# Patient Record
Sex: Female | Born: 1950 | Race: Black or African American | Hispanic: No | Smoking: Never smoker
Health system: Southern US, Community
[De-identification: ages and names within clinical notes are randomized; demographics above are authoritative.]

## PROBLEM LIST (undated history)

## (undated) DIAGNOSIS — M1990 Unspecified osteoarthritis, unspecified site: Secondary | ICD-10-CM

## (undated) HISTORY — PX: BACK SURGERY: SHX140

---

## 2004-06-13 DIAGNOSIS — E119 Type 2 diabetes mellitus without complications: Secondary | ICD-10-CM | POA: Insufficient documentation

## 2005-08-28 DIAGNOSIS — N938 Other specified abnormal uterine and vaginal bleeding: Secondary | ICD-10-CM | POA: Insufficient documentation

## 2008-02-09 DIAGNOSIS — E7800 Pure hypercholesterolemia, unspecified: Secondary | ICD-10-CM | POA: Insufficient documentation

## 2008-09-17 DIAGNOSIS — Z79899 Other long term (current) drug therapy: Secondary | ICD-10-CM | POA: Insufficient documentation

## 2008-10-07 ENCOUNTER — Ambulatory Visit: Payer: Self-pay | Admitting: Diagnostic Radiology

## 2008-10-07 ENCOUNTER — Emergency Department (HOSPITAL_BASED_OUTPATIENT_CLINIC_OR_DEPARTMENT_OTHER): Admission: EM | Admit: 2008-10-07 | Discharge: 2008-10-07 | Payer: Self-pay | Admitting: Emergency Medicine

## 2008-10-08 ENCOUNTER — Emergency Department (HOSPITAL_BASED_OUTPATIENT_CLINIC_OR_DEPARTMENT_OTHER): Admission: EM | Admit: 2008-10-08 | Discharge: 2008-10-08 | Payer: Self-pay | Admitting: Emergency Medicine

## 2011-05-22 ENCOUNTER — Emergency Department (INDEPENDENT_AMBULATORY_CARE_PROVIDER_SITE_OTHER): Payer: Federal, State, Local not specified - PPO

## 2011-05-22 ENCOUNTER — Other Ambulatory Visit: Payer: Self-pay

## 2011-05-22 ENCOUNTER — Encounter: Payer: Self-pay | Admitting: *Deleted

## 2011-05-22 ENCOUNTER — Emergency Department (HOSPITAL_BASED_OUTPATIENT_CLINIC_OR_DEPARTMENT_OTHER)
Admission: EM | Admit: 2011-05-22 | Discharge: 2011-05-22 | Disposition: A | Discharge status: Home / Self Care | Payer: Federal, State, Local not specified - PPO | Attending: Emergency Medicine | Admitting: Emergency Medicine

## 2011-05-22 DIAGNOSIS — N200 Calculus of kidney: Secondary | ICD-10-CM | POA: Insufficient documentation

## 2011-05-22 DIAGNOSIS — M549 Dorsalgia, unspecified: Secondary | ICD-10-CM

## 2011-05-22 DIAGNOSIS — E119 Type 2 diabetes mellitus without complications: Secondary | ICD-10-CM | POA: Insufficient documentation

## 2011-05-22 DIAGNOSIS — R079 Chest pain, unspecified: Secondary | ICD-10-CM

## 2011-05-22 DIAGNOSIS — Q619 Cystic kidney disease, unspecified: Secondary | ICD-10-CM

## 2011-05-22 DIAGNOSIS — R0789 Other chest pain: Secondary | ICD-10-CM

## 2011-05-22 LAB — CBC
HCT: 38.1 % (ref 36.0–46.0)
MCV: 85.2 fL (ref 78.0–100.0)
RBC: 4.47 MIL/uL (ref 3.87–5.11)
WBC: 7.7 10*3/uL (ref 4.0–10.5)

## 2011-05-22 LAB — DIFFERENTIAL
Eosinophils Relative: 1 % (ref 0–5)
Lymphocytes Relative: 30 % (ref 12–46)
Lymphs Abs: 2.3 10*3/uL (ref 0.7–4.0)
Monocytes Absolute: 0.7 10*3/uL (ref 0.1–1.0)
Monocytes Relative: 9 % (ref 3–12)

## 2011-05-22 LAB — CARDIAC PANEL(CRET KIN+CKTOT+MB+TROPI)
CK, MB: 3.9 ng/mL (ref 0.3–4.0)
CK, MB: 3.8 ng/mL (ref 0.3–4.0)
Relative Index: 2.1 (ref 0.0–2.5)
Total CK: 197 U/L — ABNORMAL HIGH (ref 7–177)

## 2011-05-22 LAB — COMPREHENSIVE METABOLIC PANEL
CO2: 27 mEq/L (ref 19–32)
Calcium: 10.2 mg/dL (ref 8.4–10.5)
Creatinine, Ser: 0.5 mg/dL (ref 0.50–1.10)
GFR calc Af Amer: >90 mL/min (ref >90)
GFR calc non Af Amer: >90 mL/min (ref >90)
Glucose, Bld: 74 mg/dL (ref 70–99)

## 2011-05-22 LAB — URINALYSIS, ROUTINE W REFLEX MICROSCOPIC
Bilirubin Urine: NEGATIVE
Nitrite: NEGATIVE
Protein, ur: NEGATIVE mg/dL
Specific Gravity, Urine: 1.023 (ref 1.005–1.030)
Urobilinogen, UA: 1 mg/dL (ref 0.0–1.0)

## 2011-05-22 NOTE — ED Provider Notes (Addendum)
History     CSN: [id number] Arrival date & time: 05/22/2011  4:21 PM   First MD Initiated Contact with Patient 05/22/11 1651      Chief Complaint  Patient presents with  . Chest Pain    (Consider location/radiation/quality/duration/timing/severity/associated sxs/prior treatment) Patient is a 60 y.o. female presenting with chest pain. The history is provided by the patient.  Chest Pain The chest pain began less than 1 hour ago. Duration of episode(s) is 2 seconds. Chest pain occurs intermittently. The chest pain is resolved. Associated with: nothing. At its most intense, the pain is at 2/10. The pain is currently at 0/10. The severity of the pain is mild. The quality of the pain is described as pressure-like and dull. The pain does not radiate. Exacerbated by: nothing. Pertinent negatives for primary symptoms include no fever, no fatigue, no shortness of breath, no cough, no wheezing, no abdominal pain, no nausea, no vomiting and no dizziness. She tried nothing for the symptoms. Risk factors include stress.  Her past medical history is significant for diabetes.  Pertinent negatives for past medical history include no CAD.  Pertinent negatives for family medical history include: no heart disease in family.  Procedure history is positive for stress thallium.     Past Medical History  Diagnosis Date  . Diabetes mellitus     History reviewed. No pertinent past surgical history.  No family history on file.  History  Substance Use Topics  . Smoking status: Never Smoker   . Smokeless tobacco: Not on file  . Alcohol Use: No    OB History    Grav Para Term Preterm Abortions TAB SAB Ect Mult Living                  Review of Systems  Constitutional: Negative for fever and fatigue.  Respiratory: Negative for cough, shortness of breath and wheezing.   Cardiovascular: Positive for chest pain.  Gastrointestinal: Negative for nausea, vomiting and abdominal pain.  Neurological:  Negative for dizziness.  All other systems reviewed and are negative.    Allergies  Review of patient's allergies indicates no known allergies.  Home Medications  No current outpatient prescriptions on file.  BP 143/73  Pulse 81  Temp(Src) 98.5 F (36.9 C) (Oral)  Resp 18  SpO2 97%  Physical Exam  Nursing note and vitals reviewed. Constitutional: She is oriented to person, place, and time. She appears well-developed and well-nourished. No distress.  HENT:  Head: Normocephalic and atraumatic.  Eyes: EOM are normal. Pupils are equal, round, and reactive to light.  Cardiovascular: Normal rate, regular rhythm, normal heart sounds and intact distal pulses.  Exam reveals no friction rub.   No murmur heard. Pulmonary/Chest: Effort normal and breath sounds normal. She has no wheezes. She has no rales. She exhibits tenderness.       Some mild tenderness while palpating the sternum  Abdominal: Soft. Bowel sounds are normal. She exhibits no distension. There is no tenderness. There is no rebound and no guarding.  Musculoskeletal: Normal range of motion. She exhibits no tenderness.       No edema  Neurological: She is alert and oriented to person, place, and time. No cranial nerve deficit.  Skin: Skin is warm and dry. No rash noted.  Psychiatric: She has a normal mood and affect. Her behavior is normal.    ED Course  Procedures (including critical care time)  Results for orders placed during the hospital encounter of 05/22/11  CBC  Component Value Range   WBC 7.7  4.0 - 10.5 (K/uL)   RBC 4.47  3.87 - 5.11 (MIL/uL)   Hemoglobin 12.2  12.0 - 15.0 (g/dL)   HCT 16.1  09.6 - 04.5 (%)   MCV 85.2  78.0 - 100.0 (fL)   MCH 27.3  26.0 - 34.0 (pg)   MCHC 32.0  30.0 - 36.0 (g/dL)   RDW 40.9  81.1 - 91.4 (%)   Platelets 349  150 - 400 (K/uL)  DIFFERENTIAL      Component Value Range   Neutrophils Relative 59  43 - 77 (%)   Neutro Abs 4.6  1.7 - 7.7 (K/uL)   Lymphocytes Relative 30   12 - 46 (%)   Lymphs Abs 2.3  0.7 - 4.0 (K/uL)   Monocytes Relative 9  3 - 12 (%)   Monocytes Absolute 0.7  0.1 - 1.0 (K/uL)   Eosinophils Relative 1  0 - 5 (%)   Eosinophils Absolute 0.1  0.0 - 0.7 (K/uL)   Basophils Relative 0  0 - 1 (%)   Basophils Absolute 0.0  0.0 - 0.1 (K/uL)  COMPREHENSIVE METABOLIC PANEL      Component Value Range   Sodium 141  135 - 145 (mEq/L)   Potassium 4.0  3.5 - 5.1 (mEq/L)   Chloride 104  96 - 112 (mEq/L)   CO2 27  19 - 32 (mEq/L)   Glucose, Bld 74  70 - 99 (mg/dL)   BUN 15  6 - 23 (mg/dL)   Creatinine, Ser 7.82  0.50 - 1.10 (mg/dL)   Calcium 95.6  8.4 - 10.5 (mg/dL)   Total Protein 7.4  6.0 - 8.3 (g/dL)   Albumin 3.6  3.5 - 5.2 (g/dL)   AST 18  0 - 37 (U/L)   ALT 11  0 - 35 (U/L)   Alkaline Phosphatase 86  39 - 117 (U/L)   Total Bilirubin 0.1 (*) 0.3 - 1.2 (mg/dL)   GFR calc non Af Amer >90  >90 (mL/min)   GFR calc Af Amer >90  >90 (mL/min)  URINALYSIS, ROUTINE W REFLEX MICROSCOPIC      Component Value Range   Color, Urine YELLOW  YELLOW    Appearance CLEAR  CLEAR    Specific Gravity, Urine 1.023  1.005 - 1.030    pH 7.0  5.0 - 8.0    Glucose, UA NEGATIVE  NEGATIVE (mg/dL)   Hgb urine dipstick NEGATIVE  NEGATIVE    Bilirubin Urine NEGATIVE  NEGATIVE    Ketones, ur NEGATIVE  NEGATIVE (mg/dL)   Protein, ur NEGATIVE  NEGATIVE (mg/dL)   Urobilinogen, UA 1.0  0.0 - 1.0 (mg/dL)   Nitrite NEGATIVE  NEGATIVE    Leukocytes, UA NEGATIVE  NEGATIVE   CARDIAC PANEL(CRET KIN+CKTOT+MB+TROPI)      Component Value Range   Total CK 197 (*) 7 - 177 (U/L)   CK, MB 3.9  0.3 - 4.0 (ng/mL)   Troponin I <0.30  <0.30 (ng/mL)   Relative Index 2.0  0.0 - 2.5   CARDIAC PANEL(CRET KIN+CKTOT+MB+TROPI)      Component Value Range   Total CK 184 (*) 7 - 177 (U/L)   CK, MB 3.8  0.3 - 4.0 (ng/mL)   Troponin I <0.30  <0.30 (ng/mL)   Relative Index 2.1  0.0 - 2.5    Dg Chest 2 View  05/22/2011  *RADIOLOGY REPORT*  Clinical Data: Chest pain.  CHEST - 2 VIEW   Comparison: None  Findings: The cardiac silhouette, mediastinal and hilar contours are within normal limits.  The lungs are clear.  No pleural effusion.  The bony thorax is intact. Bursal versus rotator cuff calcification on the right.  IMPRESSION: No acute cardiopulmonary findings.  Original Report Authenticated By: P. Loralie Champagne, M.D.   US Abdomen Complete  05/22/2011  *RADIOLOGY REPORT*  Clinical Data:  Intermittent chest/back pain  COMPLETE ABDOMINAL ULTRASOUND  Comparison:  None.  Findings:  Gallbladder:  No gallstones, gallbladder wall thickening, or pericholecystic fluid.  Negative sonographic Murphy's sign.  Common bile duct:  Measures 3 mm.  Liver:  Coarse, hyperechoic hepatic parenchyma, suggesting hepatic steatosis.  No focal hepatic lesions are seen.  IVC:  Poorly visualized due to overlying bowel gas.  Pancreas:  Poorly visualized due to overlying bowel gas.  Spleen:  Measures 5.0 cm.  Right Kidney:  Measures 12.7 cm.  2.5 x 2.4 x 2.3 cm lower pole cyst.  Left Kidney:  Measures 12.3 cm.  No mass or hydronephrosis.  Abdominal aorta:  No aneurysm identified.  IMPRESSION: Normal sonographic appearance of the gallbladder.  Suspected hepatic steatosis.  2.5 cm right lower pole renal cyst.  Original Report Authenticated By: Charline Bills, M.D.       Date: 05/22/2011  Rate: 74  Rhythm: normal sinus rhythm  QRS Axis: normal  Intervals: normal  ST/T Wave abnormalities: normal  Conduction Disutrbances:none  Narrative Interpretation:   Old EKG Reviewed: none available    No diagnosis found.    MDM   Pt with atypical story for CP with a pressure sensation for seconds and then goes away. Pt states she has had this many times before and actually within the last 6months had a nuclear stress test for the same thing and it was normal.  TIMI 1 for aspirin daily and no risk factor is DM.  No sx concerning for PE.  EKG wnl.  CXR, CBC, BMP, CE (0, 3hr) all wnl.  Pt has been pain free since  here but states the last few weeks has had more stress than normal and may be a contributor.  Denies infectious sx or resp sx.  No associated sx and not related to eating.  States earlier the pain when into her mid back but did not stay and gone now.   Will have her f/u with Dr. Silva Bandy on Monday for recheck or return if sx worsen.         Gwyneth Sprout, MD 05/22/11 2009  Gwyneth Sprout, MD 05/22/11 2010

## 2011-05-22 NOTE — ED Notes (Signed)
Pt c/o intermittent pressure in the center of her chest since last week. Pt sts today the pressure radiates to her back.

## 2011-11-08 ENCOUNTER — Emergency Department (INDEPENDENT_AMBULATORY_CARE_PROVIDER_SITE_OTHER): Payer: Federal, State, Local not specified - PPO

## 2011-11-08 ENCOUNTER — Encounter (HOSPITAL_BASED_OUTPATIENT_CLINIC_OR_DEPARTMENT_OTHER): Payer: Self-pay | Admitting: *Deleted

## 2011-11-08 ENCOUNTER — Emergency Department (HOSPITAL_BASED_OUTPATIENT_CLINIC_OR_DEPARTMENT_OTHER)
Admission: EM | Admit: 2011-11-08 | Discharge: 2011-11-08 | Disposition: A | Discharge status: Home / Self Care | Payer: Federal, State, Local not specified - PPO | Attending: Emergency Medicine | Admitting: Emergency Medicine

## 2011-11-08 DIAGNOSIS — R0789 Other chest pain: Secondary | ICD-10-CM | POA: Insufficient documentation

## 2011-11-08 DIAGNOSIS — I517 Cardiomegaly: Secondary | ICD-10-CM

## 2011-11-08 DIAGNOSIS — R011 Cardiac murmur, unspecified: Secondary | ICD-10-CM | POA: Insufficient documentation

## 2011-11-08 DIAGNOSIS — M549 Dorsalgia, unspecified: Secondary | ICD-10-CM

## 2011-11-08 DIAGNOSIS — R42 Dizziness and giddiness: Secondary | ICD-10-CM | POA: Insufficient documentation

## 2011-11-08 DIAGNOSIS — E119 Type 2 diabetes mellitus without complications: Secondary | ICD-10-CM | POA: Insufficient documentation

## 2011-11-08 DIAGNOSIS — F411 Generalized anxiety disorder: Secondary | ICD-10-CM | POA: Insufficient documentation

## 2011-11-08 DIAGNOSIS — Z86718 Personal history of other venous thrombosis and embolism: Secondary | ICD-10-CM | POA: Insufficient documentation

## 2011-11-08 DIAGNOSIS — Z794 Long term (current) use of insulin: Secondary | ICD-10-CM | POA: Insufficient documentation

## 2011-11-08 DIAGNOSIS — Z7982 Long term (current) use of aspirin: Secondary | ICD-10-CM | POA: Insufficient documentation

## 2011-11-08 DIAGNOSIS — F419 Anxiety disorder, unspecified: Secondary | ICD-10-CM

## 2011-11-08 LAB — CBC
HCT: 35.9 % — ABNORMAL LOW (ref 36.0–46.0)
Hemoglobin: 11.7 g/dL — ABNORMAL LOW (ref 12.0–15.0)
MCH: 28.3 pg (ref 26.0–34.0)
MCHC: 32.6 g/dL (ref 30.0–36.0)
MCV: 86.9 fL (ref 78.0–100.0)
RDW: 14 % (ref 11.5–15.5)

## 2011-11-08 LAB — COMPREHENSIVE METABOLIC PANEL
ALT: 17 U/L (ref 0–35)
AST: 18 U/L (ref 0–37)
Albumin: 3.2 g/dL — ABNORMAL LOW (ref 3.5–5.2)
Alkaline Phosphatase: 82 U/L (ref 39–117)
GFR calc Af Amer: >90 mL/min (ref >90)
Glucose, Bld: 90 mg/dL (ref 70–99)
Potassium: 4.2 mEq/L (ref 3.5–5.1)
Sodium: 139 mEq/L (ref 135–145)
Total Protein: 6.7 g/dL (ref 6.0–8.3)

## 2011-11-08 MED ORDER — LORAZEPAM 2 MG/ML IJ SOLN
1.0000 mg | Freq: Once | INTRAMUSCULAR | Status: AC
  Administered 2011-11-08: 1 mg via INTRAVENOUS
  Filled 2011-11-08: qty 1

## 2011-11-08 NOTE — ED Notes (Signed)
Pt here for multiple c/o.  Pt was seen here 1 year ago for same and "didnt find anything"  Pt states was at dinner tonight and became light headed.  Pt ambulatory from triage with no difficulties noted.

## 2011-11-08 NOTE — ED Notes (Signed)
Pt states she has been here for these same symptoms less than a year ago and "they didn't find anything" Also has hx of anxiety and diabetes. H/A for about a week. At noon today was sitting and felt dizzy and light-headed. Also had some back pain. Tonight at dinner, became light-headed again. Felt shaky. Nervous.

## 2011-11-08 NOTE — ED Provider Notes (Signed)
History  This chart was scribed for Gerhard Munch, MD by Shari Heritage. This patient was seen in room MH12/MH12 and the patient's care was started at 9:25PM.   CSN: [id number]  Arrival date & time 11/08/11  2108   First MD Initiated Contact with Patient 11/08/11 2117      Chief Complaint  Patient presents with  . Back Pain     The history is provided by the patient. No language interpreter was used.    Tracy Parks is a 61 y.o. female who presents to the Emergency Department complaining of moderate back pain and persistence since onset 10 hours ago associated lightheadness, dizziness, and anxiety. Patient also reports a headache that radiated to the upper back and chest that began several days ago but has since resolved. Patient began feeling lightheaded and dizzy during dinner. Patient describes the sensation as "woozy." Patient reports being startled by fire at [employer] during dinner and thinks that may have triggered her anxiety. Patient drank hot tea with mild improvement in anxiety levels. Patient came to ED one year ago for similar symptoms but was given no specific diagnosis. Patient denies fever, chills, SOB, abdominal pain, visual disturbance. Patient with h/o of diabetes and a blood clot in the right leg. Patient took blood thinners for four months, but ended medication 1 year ago. Patient is a non-smoker and denies alcohol use. She notes that she had a "normal" stress-echo ~1 yr ago at [facility]    Past Medical History  Diagnosis Date  . Diabetes mellitus     History reviewed. No pertinent past surgical history.  History reviewed. No pertinent family history.  History  Substance Use Topics  . Smoking status: Never Smoker   . Smokeless tobacco: Not on file  . Alcohol Use: No     Review of Systems Patient is positive for lightheadedness, dizziness, back pain, anxiety. Patient is negative for fever, chills, sore throat, congestions, chest pain, SOB, abdominal  pain, nausea, cough, chills, visual disturbance, dysuria, rash, weakness and confusion.  Allergies  Review of patient's allergies indicates no known allergies.  Home Medications   Current Outpatient Rx  Name Route Sig Dispense Refill  . ASPIRIN 81 MG PO TABS Oral Take 81 mg by mouth daily.    Marland Kitchen CALCIUM CARBONATE 600 MG PO TABS Oral Take 600 mg by mouth 2 (two) times daily with a meal.    . DICLOFENAC SODIUM 75 MG PO TBEC Oral Take 75 mg by mouth 2 (two) times daily.    Marland Kitchen GLIPIZIDE 5 MG PO TABS Oral Take 5 mg by mouth 2 (two) times daily before a meal.    . INSULIN GLARGINE 100 UNIT/ML [place] SOLN Subcutaneous Inject 30 Units into the skin daily.    Marland Kitchen LOSARTAN POTASSIUM 25 MG PO TABS Oral Take 25 mg by mouth daily.    Marland Kitchen METFORMIN HCL 1000 MG PO TABS Oral Take 1,000 mg by mouth 2 (two) times daily with a meal.    . SIMVASTATIN 40 MG PO TABS Oral Take 40 mg by mouth every evening.      Triage Vitals: BP 174/85  Pulse 67  Temp(Src) 98.2 F (36.8 C) (Oral)  Resp 18  Ht 5\' 4"  (1.626 m)  Wt 248 lb (112.492 kg)  BMI 42.57 kg/m2  SpO2 96%  Physical Exam  Nursing note and vitals reviewed. Constitutional: She is oriented to person, place, and time. She appears well-developed and well-nourished.  HENT:  Head: Normocephalic and atraumatic.  Neck:  Normal range of motion. Neck supple.       Full range of motion with supple neck. No paraspinal tenderness.  Cardiovascular: Normal rate and regular rhythm.   Murmur (slight murmur) heard. Pulmonary/Chest: Breath sounds normal. No respiratory distress. She has no wheezes. She has no rales.  Abdominal: Bowel sounds are normal. She exhibits no distension. There is no tenderness. There is no rebound.  Musculoskeletal: Normal range of motion.  Neurological: She is alert and oriented to person, place, and time.  Skin: Skin is warm and dry.  Psychiatric: She has a normal mood and affect. Her behavior is normal.    ED Course  Procedures (including  critical care time)  DIAGNOSTIC STUDIES: Oxygen Saturation is 96% on room air, adequate by my interpretation.   Pulse is 67.sr normal BP is 174/85.   Date: 11/08/2011  Rate: 60  Rhythm: normal sinus rhythm  QRS Axis: normal  Intervals: normal  ST/T Wave abnormalities: normal  Conduction Disutrbances:none  Narrative Interpretation:   Old EKG Reviewed: unchanged NORMAL   COORDINATION OF CARE: 9:36PM-Patient is informed of plan for care and agrees with course of treatment. I ordered blood work, chest X-ray and recommended medication for anxiety and administered Ativan.   Labs Reviewed  COMPREHENSIVE METABOLIC PANEL - Abnormal; Notable for the following:    Albumin 3.2 (*)    Total Bilirubin 0.2 (*)    All other components within normal limits  CBC - Abnormal; Notable for the following:    Hemoglobin 11.7 (*)    HCT 35.9 (*)    All other components within normal limits  TROPONIN I   Dg Chest 2 View  11/08/2011  *RADIOLOGY REPORT*  Clinical Data: Chest discomfort  CHEST - 2 VIEW  Comparison: 05/22/2011  Findings: Heart size upper normal limits to mildly enlarged. Aortic arch atherosclerosis.  Mild retrocardiac scarring versus atelectasis.  Otherwise no focal consolidation.  No pleural effusion or pneumothorax.  Multilevel degenerative changes.  No acute osseous abnormality.  IMPRESSION: Heart size upper normal limits to mildly large.  No acute process identified.  Original Report Authenticated By: Waneta Martins, M.D.     No diagnosis found.  On re-eval the patient noted a significant improvement in her condition.  MDM  I personally performed the services described in this documentation, which was scribed in my presence. The recorded information has been reviewed and considered.  This elderly F w MMP, semi-recent normal ECHO, now p/w back pain and anxiety.  On my exam the patient is in no distress, w unremarkable VS.  ECG is normal.  Given the absence of chest pain  complaints, vomiting, ams, and the endorsement of anxiety, there is low suspicion for ongoing acute pathology.  All findings, and the need for continued PMD F/U were discussed with the patient and her niece (who throughout notes that the patient seems "the same."      Gerhard Munch, MD 11/08/11 2358

## 2011-11-08 NOTE — Discharge Instructions (Signed)
Although your evaluation thus far has been reassuring, it is very important that you continue to have your condition re-evaluated and managed by your physician.  Please be sure to call tomorrow morning to discuss today's visit.  If you develop any new, or concerning changes in your condition.

## 2011-11-08 NOTE — ED Notes (Signed)
ECG done on arrival

## 2013-12-05 DIAGNOSIS — M160 Bilateral primary osteoarthritis of hip: Secondary | ICD-10-CM | POA: Insufficient documentation

## 2014-02-21 DIAGNOSIS — M5430 Sciatica, unspecified side: Secondary | ICD-10-CM | POA: Insufficient documentation

## 2014-02-21 DIAGNOSIS — I10 Essential (primary) hypertension: Secondary | ICD-10-CM | POA: Insufficient documentation

## 2014-02-21 DIAGNOSIS — E785 Hyperlipidemia, unspecified: Secondary | ICD-10-CM | POA: Insufficient documentation

## 2015-09-04 ENCOUNTER — Emergency Department (HOSPITAL_BASED_OUTPATIENT_CLINIC_OR_DEPARTMENT_OTHER)
Admission: EM | Admit: 2015-09-04 | Discharge: 2015-09-04 | Disposition: A | Discharge status: Home / Self Care | Payer: Medicare Other | Attending: Emergency Medicine | Admitting: Emergency Medicine

## 2015-09-04 DIAGNOSIS — Z79899 Other long term (current) drug therapy: Secondary | ICD-10-CM | POA: Insufficient documentation

## 2015-09-04 DIAGNOSIS — Z7982 Long term (current) use of aspirin: Secondary | ICD-10-CM | POA: Diagnosis not present

## 2015-09-04 DIAGNOSIS — E119 Type 2 diabetes mellitus without complications: Secondary | ICD-10-CM | POA: Insufficient documentation

## 2015-09-04 DIAGNOSIS — M542 Cervicalgia: Secondary | ICD-10-CM | POA: Insufficient documentation

## 2015-09-04 DIAGNOSIS — Z7984 Long term (current) use of oral hypoglycemic drugs: Secondary | ICD-10-CM | POA: Diagnosis not present

## 2015-09-04 DIAGNOSIS — Z794 Long term (current) use of insulin: Secondary | ICD-10-CM | POA: Insufficient documentation

## 2015-09-04 DIAGNOSIS — Z791 Long term (current) use of non-steroidal anti-inflammatories (NSAID): Secondary | ICD-10-CM | POA: Diagnosis not present

## 2015-09-04 DIAGNOSIS — M545 Low back pain: Secondary | ICD-10-CM | POA: Insufficient documentation

## 2015-09-04 MED ORDER — CYCLOBENZAPRINE HCL 10 MG PO TABS: 10.0000 mg | ORAL_TABLET | Freq: Three times a day (TID) | ORAL | Status: AC | PRN

## 2015-09-04 MED ORDER — HYDROCODONE-ACETAMINOPHEN 5-325 MG PO TABS: 1.0000 | ORAL_TABLET | Freq: Four times a day (QID) | ORAL | Status: AC | PRN

## 2015-09-04 NOTE — ED Notes (Signed)
Pt reports neck pain since Saturday motrin and heat gives minimal relief

## 2015-09-04 NOTE — ED Notes (Addendum)
Bilateral neck pain and stiffness not relieved by motrin at home.  Pt has prescription for tramadol, states she tries not to take it unless she needs it.  Pt last took  ibuprofen at 2200

## 2015-09-04 NOTE — ED Provider Notes (Signed)
CSN: [id number]     Arrival date & time 09/04/15  0010 History   First MD Initiated Contact with Patient 09/04/15 0345     Chief Complaint  Patient presents with  . Neck Pain     (Consider location/radiation/quality/duration/timing/severity/associated sxs/prior Treatment) HPI  This is a 65 year old female with a four-day history of pain in her neck. The pain is moderate to severe and is located in the soft tissue of the sides and back of the neck. Her neck feels stiff and she is having difficulty rotating her head to the left of the right due to pain. She denies injury. She denies radiation of the pain. She denies numbness or weakness of the upper extremities. She is here because she is having difficulty finding a comfortable position to sleep.  Past Medical History  Diagnosis Date  . Diabetes mellitus    No past surgical history on file. No family history on file. Social History  Substance Use Topics  . Smoking status: Never Smoker   . Smokeless tobacco: Not on file  . Alcohol Use: No   OB History    No data available     Review of Systems  All other systems reviewed and are negative.   Allergies  Lisinopril and Victoza  Home Medications   Prior to Admission medications   Medication Sig Start Date End Date Taking? Authorizing Provider  aspirin 81 MG tablet Take 81 mg by mouth daily.    Historical Provider, MD  calcium carbonate (OS-CAL) 600 MG TABS Take 600 mg by mouth 2 (two) times daily with a meal.    Historical Provider, MD  diclofenac (VOLTAREN) 75 MG EC tablet Take 75 mg by mouth 2 (two) times daily.    Historical Provider, MD  glipiZIDE (GLUCOTROL) 5 MG tablet Take 5 mg by mouth 2 (two) times daily before a meal.    Historical Provider, MD  insulin glargine (LANTUS) 100 UNIT/ML injection Inject 30 Units into the skin daily.    Historical Provider, MD  losartan (COZAAR) 25 MG tablet Take 25 mg by mouth daily.    Historical Provider, MD  metFORMIN (GLUCOPHAGE) 1000  MG tablet Take 1,000 mg by mouth 2 (two) times daily with a meal.    Historical Provider, MD  simvastatin (ZOCOR) 40 MG tablet Take 40 mg by mouth every evening.    Historical Provider, MD   BP 156/97 mmHg  Pulse 67  Temp(Src) 98 F (36.7 C) (Oral)  Resp 18  Ht  (1.651 m)  Wt 236 lb (107.049 kg)  BMI 39.27 kg/m2  SpO2 98%   Physical Exam  General: Well-developed, well-nourished female in no acute distress; appearance consistent with age of record HENT: normocephalic; atraumatic Eyes: pupils equal, round and reactive to light; extraocular muscles intact Neck: supple; posterior soft tissue tenderness with pain on rotation of the neck Heart: regular rate and rhythm Lungs: clear to auscultation bilaterally Abdomen: soft; nondistended; nontender; bowel sounds present Extremities: No deformity; full range of motion; pulses normal Neurologic: Awake, alert and oriented; motor function intact in all extremities and symmetric; no facial droop Skin: Warm and dry Psychiatric: Normal mood and affect    ED Course  Procedures (including critical care time)   MDM      Paula Libra, MD 09/04/15 [phone]

## 2015-09-04 NOTE — ED Notes (Signed)
MD at bedside. 

## 2015-09-04 NOTE — ED Notes (Signed)
Pt verbalizes understanding of d/c instructions and denies any further needs at this time. 

## 2016-08-17 ENCOUNTER — Encounter (HOSPITAL_BASED_OUTPATIENT_CLINIC_OR_DEPARTMENT_OTHER): Payer: Self-pay | Admitting: *Deleted

## 2016-08-17 ENCOUNTER — Emergency Department (HOSPITAL_BASED_OUTPATIENT_CLINIC_OR_DEPARTMENT_OTHER)
Admission: EM | Admit: 2016-08-17 | Discharge: 2016-08-17 | Disposition: A | Discharge status: Home / Self Care | Payer: Medicare Other | Attending: Emergency Medicine | Admitting: Emergency Medicine

## 2016-08-17 ENCOUNTER — Emergency Department (HOSPITAL_BASED_OUTPATIENT_CLINIC_OR_DEPARTMENT_OTHER): Payer: Medicare Other

## 2016-08-17 DIAGNOSIS — Z79899 Other long term (current) drug therapy: Secondary | ICD-10-CM | POA: Diagnosis not present

## 2016-08-17 DIAGNOSIS — R079 Chest pain, unspecified: Secondary | ICD-10-CM | POA: Diagnosis present

## 2016-08-17 DIAGNOSIS — E119 Type 2 diabetes mellitus without complications: Secondary | ICD-10-CM | POA: Diagnosis not present

## 2016-08-17 DIAGNOSIS — K219 Gastro-esophageal reflux disease without esophagitis: Secondary | ICD-10-CM | POA: Diagnosis not present

## 2016-08-17 DIAGNOSIS — Z794 Long term (current) use of insulin: Secondary | ICD-10-CM | POA: Diagnosis not present

## 2016-08-17 DIAGNOSIS — Z7982 Long term (current) use of aspirin: Secondary | ICD-10-CM | POA: Diagnosis not present

## 2016-08-17 DIAGNOSIS — K2960 Other gastritis without bleeding: Secondary | ICD-10-CM

## 2016-08-17 LAB — CBC
HCT: 36.9 % (ref 36.0–46.0)
Hemoglobin: 11.2 g/dL — ABNORMAL LOW (ref 12.0–15.0)
MCH: 25.3 pg — AB (ref 26.0–34.0)
MCHC: 30.4 g/dL (ref 30.0–36.0)
MCV: 83.5 fL (ref 78.0–100.0)
PLATELETS: 497 10*3/uL — AB (ref 150–400)
RBC: 4.42 MIL/uL (ref 3.87–5.11)
RDW: 16.9 % — ABNORMAL HIGH (ref 11.5–15.5)
WBC: 9.6 10*3/uL (ref 4.0–10.5)

## 2016-08-17 LAB — COMPREHENSIVE METABOLIC PANEL
ALK PHOS: 69 U/L (ref 38–126)
ALT: 24 U/L (ref 14–54)
AST: 25 U/L (ref 15–41)
Albumin: 3.4 g/dL — ABNORMAL LOW (ref 3.5–5.0)
Anion gap: 8 (ref 5–15)
BILIRUBIN TOTAL: 0.7 mg/dL (ref 0.3–1.2)
BUN: 14 mg/dL (ref 6–20)
CALCIUM: 9.5 mg/dL (ref 8.9–10.3)
CO2: 31 mmol/L (ref 22–32)
CREATININE: 0.75 mg/dL (ref 0.44–1.00)
Chloride: 101 mmol/L (ref 101–111)
Glucose, Bld: 180 mg/dL — ABNORMAL HIGH (ref 65–99)
Potassium: 3.5 mmol/L (ref 3.5–5.1)
Sodium: 140 mmol/L (ref 135–145)
TOTAL PROTEIN: 6.7 g/dL (ref 6.5–8.1)

## 2016-08-17 LAB — TROPONIN I

## 2016-08-17 MED ORDER — OMEPRAZOLE 20 MG PO CPDR: 20.0000 mg | DELAYED_RELEASE_CAPSULE | Freq: Every day | ORAL | 0 refills | Status: AC

## 2016-08-17 NOTE — ED Triage Notes (Signed)
Pt reports burning pain to her chest/epigastric area x 2 weeks off and on, seen by her pcp and told she is anemic, has apt with gi for follow up. Denies sob, burning moves from right to left of chest.

## 2016-08-17 NOTE — ED Provider Notes (Signed)
[facility] Provider Note   CSN: [id number] Arrival date & time: 08/17/16  1018     History   Chief Complaint Chief Complaint  Patient presents with  . Chest Pain    HPI  Blood pressure 146/94, pulse 96, temperature 97.9 F (36.6 C), temperature source Oral, resp. rate 18, height 5\' 3"  (1.6 m), weight 100.2 kg, SpO2 100 %.  Tracy Parks is a 66 y.o. female with past medical history significant for insulin-dependent diabetes, hyperlipidemia, states that she does not have high blood pressure but they started her on losartan for her diabetes, complaining of acute onset of right-sided chest pain this morning when she was driving her car. It radiated around to the left chest. She describes a sensation as burning, this frightened her and she drove immediately to the hospital, it subsided spontaneously after about 10 minutes. She cannot describe any exacerbating or alleviating factors. She had a similar episode several weeks ago. There was no associated diaphoresis, dizziness, nausea, vomiting, cough, fever, shortness of breath. She denies any family history of the ACS. She states that she recently saw her primary care doctor for her yearly checkup and was found to be anemic, guaiac is negative, pending GI evaluation. She states that she does have a history of DVT in the left leg this was provoked by a fall, she's not anticoagulated right now and she denies any recent trauma, falls, immobilizations, swelling or pain to that leg. Has never been a smoker.  Past Medical History:  Diagnosis Date  . Diabetes mellitus     There are no active problems to display for this patient.   No past surgical history on file.  OB History    No data available       Home Medications    Prior to Admission medications   Medication Sig Start Date End Date Taking? Authorizing Provider  aspirin 81 MG tablet Take 81 mg by mouth daily.    Historical Provider, MD  calcium carbonate (OS-CAL) 600  MG TABS Take 600 mg by mouth 2 (two) times daily with a meal.    Historical Provider, MD  cyclobenzaprine (FLEXERIL) 10 MG tablet Take 1 tablet (10 mg total) by mouth 3 (three) times daily as needed for muscle spasms. 09/04/15   John Molpus, MD  glipiZIDE (GLUCOTROL) 5 MG tablet Take 5 mg by mouth 2 (two) times daily before a meal.    Historical Provider, MD  HYDROcodone-acetaminophen (NORCO/VICODIN) 5-325 MG tablet Take 1-2 tablets by mouth every 6 (six) hours as needed (for neck pain). 09/04/15   John Molpus, MD  insulin glargine (LANTUS) 100 UNIT/ML injection Inject 30 Units into the skin daily.    Historical Provider, MD  losartan (COZAAR) 25 MG tablet Take 25 mg by mouth daily.    Historical Provider, MD  metFORMIN (GLUCOPHAGE) 1000 MG tablet Take 1,000 mg by mouth 2 (two) times daily with a meal.    Historical Provider, MD  omeprazole (PRILOSEC) 20 MG capsule Take 1 capsule (20 mg total) by mouth daily. 08/17/16   Roan Miklos, PA-C  simvastatin (ZOCOR) 40 MG tablet Take 40 mg by mouth every evening.    Historical Provider, MD    Family History No family history on file.  Social History Social History  Substance Use Topics  . Smoking status: Never Smoker  . Smokeless tobacco: Not on file  . Alcohol use No     Allergies   Lisinopril and Victoza [liraglutide]   Review of Systems  Review of Systems  10 systems reviewed and found to be negative, except as noted in the HPI.   Physical Exam Updated Vital Signs BP 110/61   Pulse 101   Temp 97.9 F (36.6 C) (Oral)   Resp 17   Ht 5\' 3"  (1.6 m)   Wt 100.2 kg   SpO2 100%   BMI 39.15 kg/m   Physical Exam  Constitutional: She is oriented to person, place, and time. She appears well-developed and well-nourished. No distress.  Obese  HENT:  Head: Normocephalic.  Mouth/Throat: Oropharynx is clear and moist.  Eyes: Conjunctivae are normal.  Neck: Normal range of motion. No JVD present. No tracheal deviation present.    Cardiovascular: Normal rate, regular rhythm and intact distal pulses.   Radial pulse equal bilaterally  Pulmonary/Chest: Effort normal and breath sounds normal. No stridor. No respiratory distress. She has no wheezes. She has no rales. She exhibits no tenderness.  Abdominal: Soft. She exhibits no distension and no mass. There is no tenderness. There is no rebound and no guarding.  Musculoskeletal: Normal range of motion. She exhibits no edema or tenderness.  No calf asymmetry, superficial collaterals, palpable cords, edema, Homans sign negative bilaterally.    Neurological: She is alert and oriented to person, place, and time.  Skin: Skin is warm. She is not diaphoretic.  Psychiatric: She has a normal mood and affect.  Nursing note and vitals reviewed.    ED Treatments / Results  Labs (all labs ordered are listed, but only abnormal results are displayed) Labs Reviewed  CBC - Abnormal; Notable for the following:       Result Value   Hemoglobin 11.2 (*)    MCH 25.3 (*)    RDW 16.9 (*)    Platelets 497 (*)    All other components within normal limits  COMPREHENSIVE METABOLIC PANEL - Abnormal; Notable for the following:    Glucose, Bld 180 (*)    Albumin 3.4 (*)    All other components within normal limits  TROPONIN I  TROPONIN I    EKG  EKG Interpretation  Date/Time:  Monday August 17 2016 10:34:37 EST Ventricular Rate:  86 PR Interval:  158 QRS Duration: 92 QT Interval:  358 QTC Calculation: 428 R Axis:   30 Text Interpretation:  Normal sinus rhythm Anterior infarct , age undetermined Abnormal ECG Poor R wave progression Confirmed by LITTLE MD, RACHEL [phone]([id number]) on 08/17/2016 10:43:36 AM       Radiology Dg Chest 2 View  Result Date: 08/17/2016 CLINICAL DATA:  Burning chest pain for 1 day. EXAM: CHEST  2 VIEW COMPARISON:  11/08/2011 FINDINGS: The cardiac silhouette remains upper limits of normal in size. The patient has taken a shallower inspiration than on the prior  study, particularly on the lateral radiograph were there is mild posterior basilar opacity. No lobar consolidation, edema, pleural effusion, or pneumothorax is identified. Thoracic spondylosis and aortic atherosclerosis are noted. IMPRESSION: 1. Hypoinflation with mild basilar opacity, likely atelectasis. 2. Aortic atherosclerosis. Electronically Signed   By: Sebastian AcheAllen  Grady M.D.   On: 08/17/2016 11:21    Procedures Procedures (including critical care time)  Medications Ordered in ED Medications - No data to display   Initial Impression / Assessment and Plan / ED Course  I have reviewed the triage vital signs and the nursing notes.  Pertinent labs & imaging results that were available during my care of the patient were reviewed by me and considered in my medical decision making (see chart  for details).     Vitals:   08/17/16 1300 08/17/16 1330 08/17/16 1509 08/17/16 1530  BP: 113/73 121/77 116/60 110/61  Pulse: 90 92 109 101  Resp: 19 15 17 17   Temp:      TempSrc:      SpO2: 96% 96% 99% 100%  Weight:      Height:        Medications - No data to display  Monick Rena is 66 y.o. female presenting with Right-sided chest pain described as burning radiating around to left anterior chest and resolving spontaneously after 10 minutes with no other associated symptoms. Very atypical, I doubt this is ACS. EKG with no significant findings, chest x-ray clear, blood work including troponin negative.  Patient was seen and observed in the ED for multiple hours, she's had no recurrence of her pain. Second troponin is negative.  I think this is GI in nature, attending physician has evaluated the patient and agrees that she is stable for discharge home with close follow-up with primary care. We've had an extensive discussion of return precautions and patient verbalized understanding.  Evaluation does not show pathology that would require ongoing emergent intervention or inpatient treatment. Pt is  hemodynamically stable and mentating appropriately. Discussed findings and plan with patient/guardian, who agrees with care plan. All questions answered. Return precautions discussed and outpatient follow up given.   Final Clinical Impressions(s) / ED Diagnoses   Final diagnoses:  Reflux gastritis    New Prescriptions Discharge Medication List as of 08/17/2016  3:41 PM    START taking these medications   Details  omeprazole (PRILOSEC) 20 MG capsule Take 1 capsule (20 mg total) by mouth daily., Starting Mon 08/17/2016, [employer], PA-C 08/17/16 1634    Laurence Spates, MD 08/18/16 [phone]

## 2016-08-17 NOTE — Discharge Instructions (Signed)
Please follow with your primary care doctor in the next 2 days for a check-up. They must obtain records for further management.  ° °Do not hesitate to return to the Emergency Department for any new, worsening or concerning symptoms.  ° °

## 2016-10-27 DIAGNOSIS — D509 Iron deficiency anemia, unspecified: Secondary | ICD-10-CM | POA: Insufficient documentation

## 2018-02-02 DIAGNOSIS — G8929 Other chronic pain: Secondary | ICD-10-CM | POA: Insufficient documentation

## 2018-06-07 IMAGING — CR DG CHEST 2V
2 series · 2 of 2 positions shown · non-contrast
Comparison: 11/08/2011

CLINICAL DATA: Burning chest pain for 1 day.

EXAM:
CHEST  2 VIEW

[w chest pa]
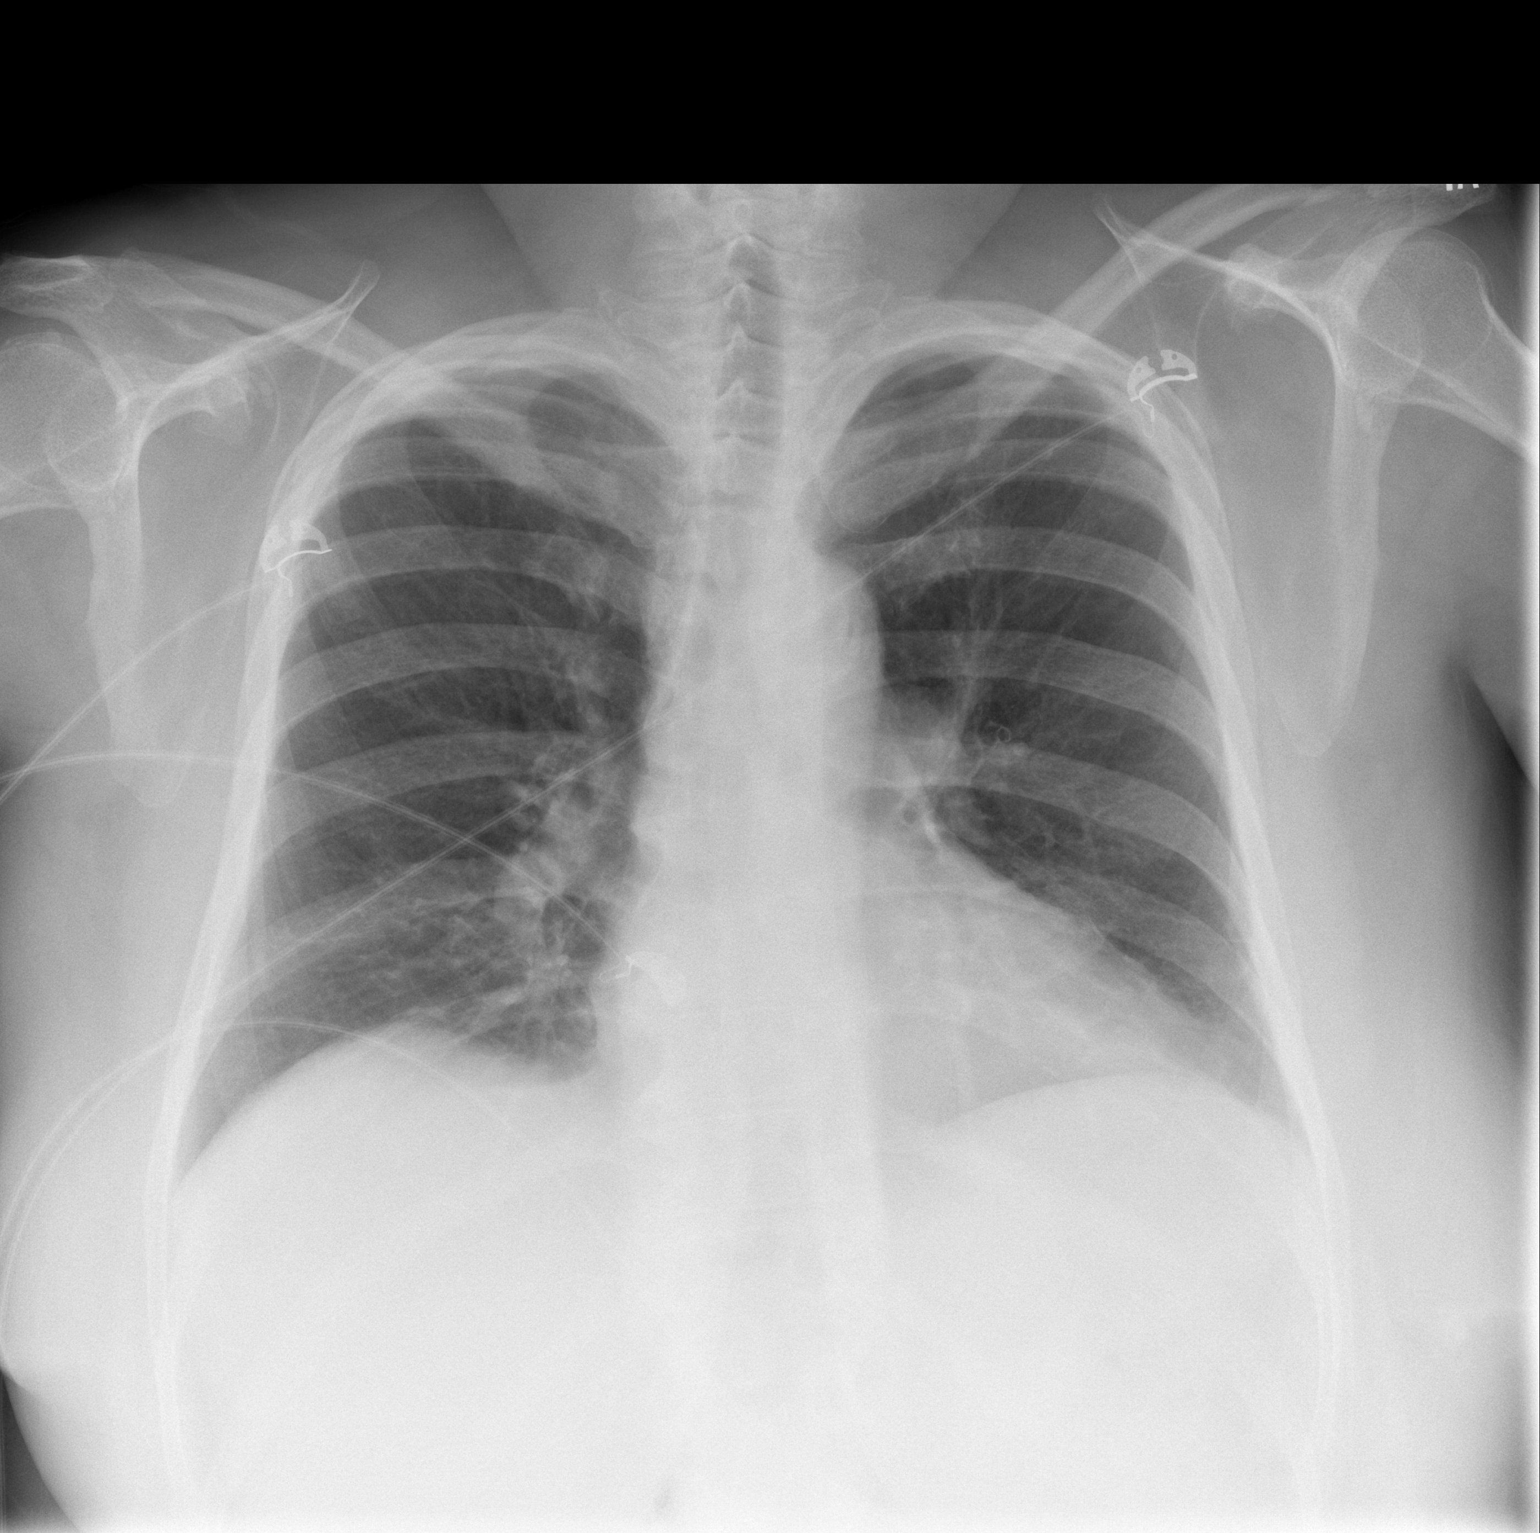

[w chest lat]
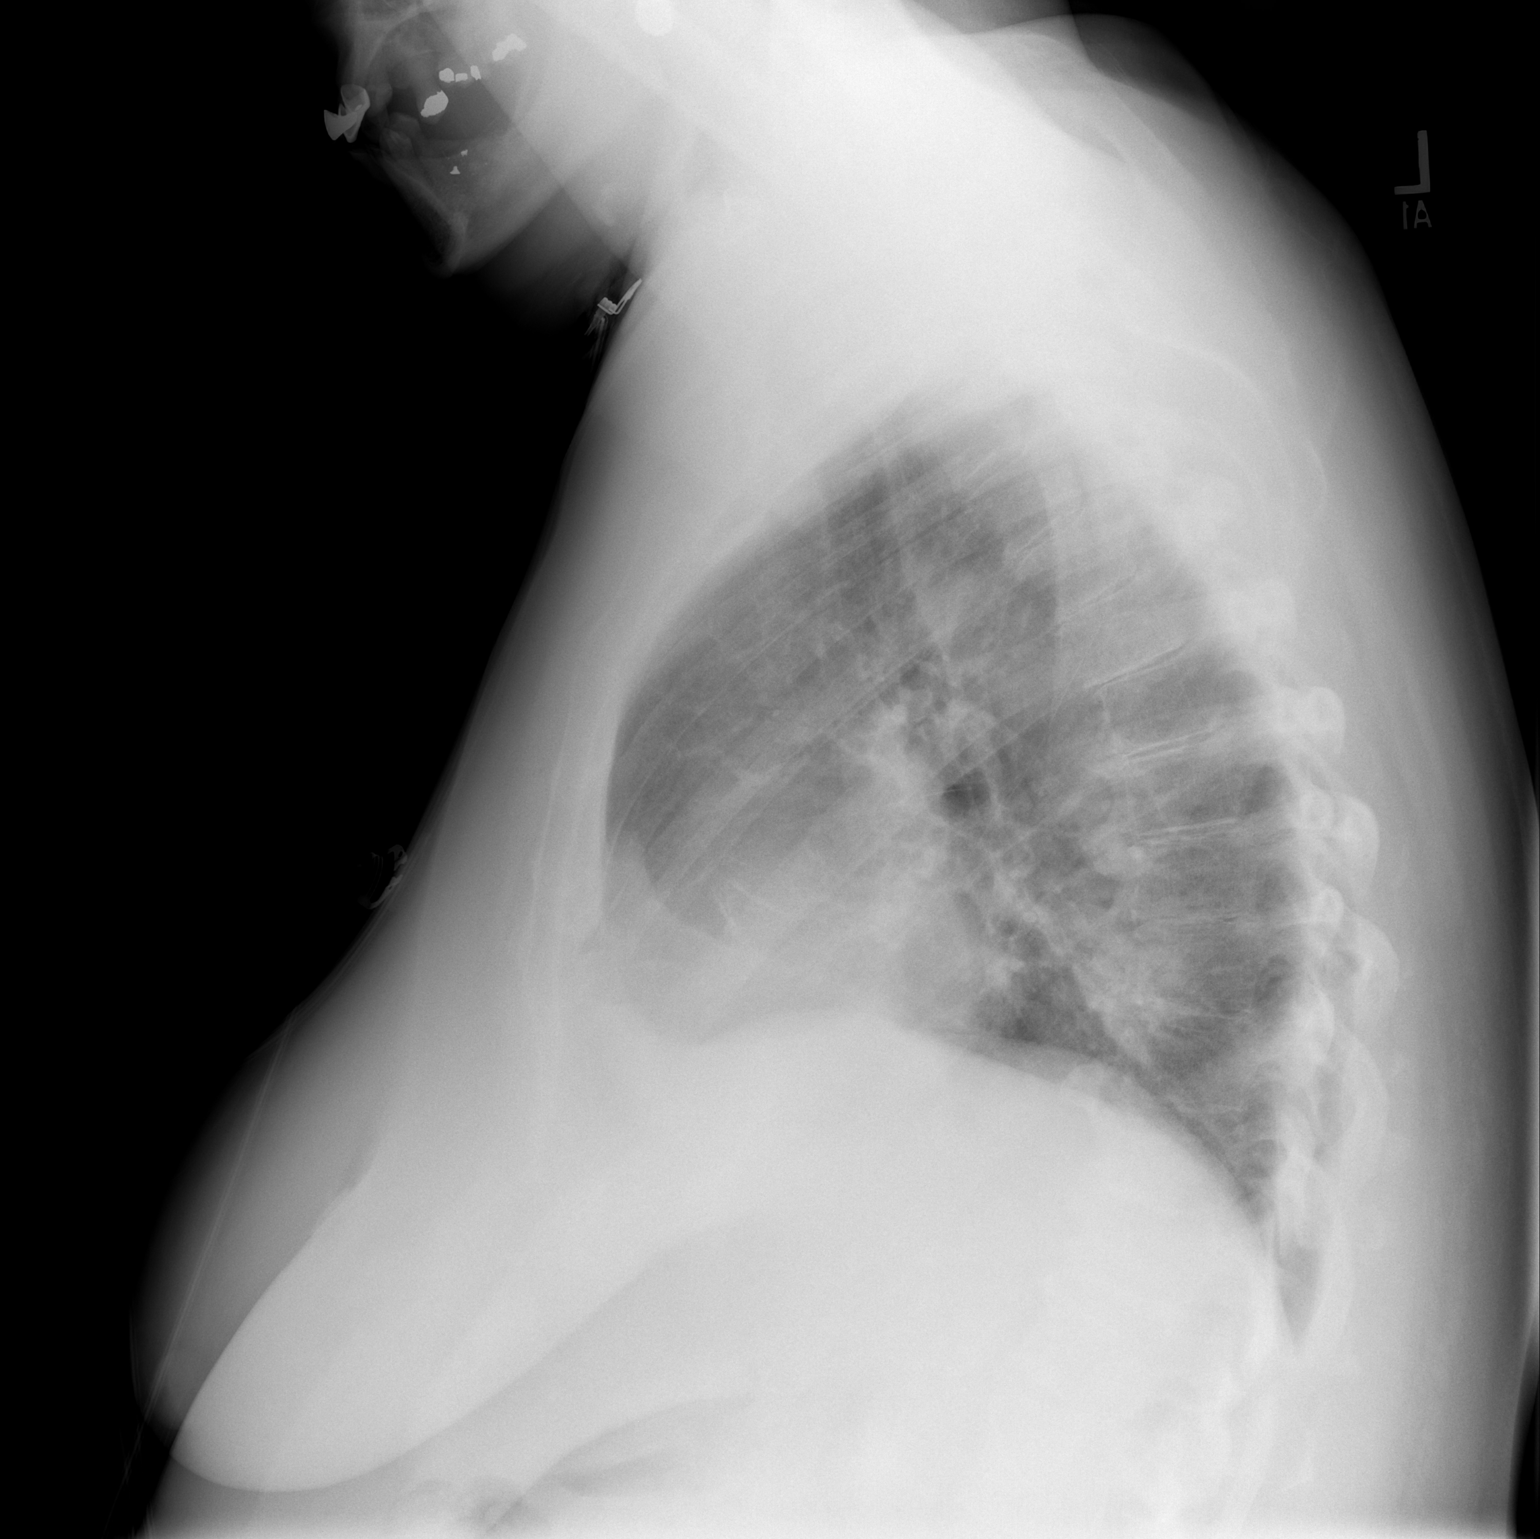

[2 of 2 positions shown; findings below may reference images not displayed]

FINDINGS: The cardiac silhouette remains upper limits of normal in size. The
patient has taken a shallower inspiration than on the prior study,
particularly on the lateral radiograph were there is mild posterior
basilar opacity. No lobar consolidation, edema, pleural effusion, or
pneumothorax is identified. Thoracic spondylosis and aortic
atherosclerosis are noted.
IMPRESSION: 1. Hypoinflation with mild basilar opacity, likely atelectasis.
2. Aortic atherosclerosis.

## 2018-10-30 DIAGNOSIS — D75839 Thrombocytosis, unspecified: Secondary | ICD-10-CM | POA: Insufficient documentation

## 2019-08-25 ENCOUNTER — Ambulatory Visit: Payer: Medicare Other | Attending: Internal Medicine

## 2019-08-25 DIAGNOSIS — Z23 Encounter for immunization: Secondary | ICD-10-CM | POA: Insufficient documentation

## 2019-08-25 NOTE — Progress Notes (Signed)
   Covid-19 Vaccination Clinic  Name:  Tracy Parks    MRN: [id number] DOB: 07-28-1950  08/25/2019  Tracy Parks was observed post Covid-19 immunization for 15 minutes without incidence. She was provided with Vaccine Information Sheet and instruction to access the V-Safe system.   Tracy Parks was instructed to call 911 with any severe reactions post vaccine: Marland Kitchen Difficulty breathing  . Swelling of your face and throat  . A fast heartbeat  . A bad rash all over your body  . Dizziness and weakness    Immunizations Administered    Name Date Dose VIS Date Route   Pfizer COVID-19 Vaccine 08/25/2019 12:29 PM 0.3 mL 06/23/2019 Intramuscular   Manufacturer: [employer], [employer]   Lot: [id number]   NDC: [address]-3

## 2019-09-17 ENCOUNTER — Ambulatory Visit: Payer: Medicare Other | Attending: Internal Medicine

## 2019-09-17 DIAGNOSIS — Z23 Encounter for immunization: Secondary | ICD-10-CM | POA: Insufficient documentation

## 2019-09-17 NOTE — Progress Notes (Signed)
   Covid-19 Vaccination Clinic  Name:  Tracy Parks    MRN: [id number] DOB: 1950-08-14  09/17/2019  [name] was observed post Covid-19 immunization for 15 minutes without incident. She was provided with Vaccine Information Sheet and instruction to access the V-Safe system.   [name] was instructed to call 911 with any severe reactions post vaccine: Marland Kitchen Difficulty breathing  . Swelling of face and throat  . A fast heartbeat  . A bad rash all over body  . Dizziness and weakness   Immunizations Administered    Name Date Dose VIS Date Route   Pfizer COVID-19 Vaccine 09/17/2019  8:11 AM 0.3 mL 06/23/2019 Intramuscular   Manufacturer: [employer], [employer]   Lot: [id number]   NDC: [address]-2

## 2019-11-26 DIAGNOSIS — R82998 Other abnormal findings in urine: Secondary | ICD-10-CM | POA: Insufficient documentation

## 2020-02-29 ENCOUNTER — Encounter (HOSPITAL_BASED_OUTPATIENT_CLINIC_OR_DEPARTMENT_OTHER): Payer: Self-pay | Admitting: *Deleted

## 2020-02-29 ENCOUNTER — Emergency Department (HOSPITAL_BASED_OUTPATIENT_CLINIC_OR_DEPARTMENT_OTHER)
Admission: EM | Admit: 2020-02-29 | Discharge: 2020-02-29 | Disposition: A | Discharge status: Home / Self Care | Payer: Medicare Other | Attending: Emergency Medicine | Admitting: Emergency Medicine

## 2020-02-29 ENCOUNTER — Other Ambulatory Visit: Payer: Self-pay

## 2020-02-29 DIAGNOSIS — M545 Low back pain: Secondary | ICD-10-CM | POA: Diagnosis not present

## 2020-02-29 DIAGNOSIS — E119 Type 2 diabetes mellitus without complications: Secondary | ICD-10-CM | POA: Diagnosis not present

## 2020-02-29 DIAGNOSIS — M5410 Radiculopathy, site unspecified: Secondary | ICD-10-CM

## 2020-02-29 DIAGNOSIS — M79604 Pain in right leg: Secondary | ICD-10-CM | POA: Diagnosis not present

## 2020-02-29 HISTORY — DX: Unspecified osteoarthritis, unspecified site: M19.90

## 2020-02-29 MED ORDER — TRAMADOL HCL 50 MG PO TABS: 100.0000 mg | ORAL_TABLET | Freq: Two times a day (BID) | ORAL | 0 refills | Status: AC | PRN

## 2020-02-29 MED ORDER — GABAPENTIN 300 MG PO CAPS: 600.0000 mg | ORAL_CAPSULE | Freq: Two times a day (BID) | ORAL | 0 refills | Status: AC

## 2020-02-29 NOTE — ED Provider Notes (Signed)
Emergency Department Provider Note   I have reviewed the triage vital signs and the nursing notes.   HISTORY  Chief Complaint Back Pain   HPI Tracy Parks is a 69 y.o. female presents to the ED with back pain radiating down the right leg. Patient has been following with neurosurgery for known lumbar DDD. Surgery offered but patient opting for non-surgical options. She has been taking multiple medications at home without relief. She was given Decadron in the last week with no improvement. No fever or chills. No leg numbness/weakness. No groin numbness. No bowel bladder incontinence or retention symptoms. No injuries.    Past Medical History:  Diagnosis Date  . Arthritis   . Diabetes mellitus     There are no problems to display for this patient.   History reviewed. No pertinent surgical history.  Allergies Jardiance [empagliflozin], Lisinopril, and Victoza [liraglutide]  No family history on file.  Social History Social History   Tobacco Use  . Smoking status: Never Smoker  . Smokeless tobacco: Never Used  Substance Use Topics  . Alcohol use: No  . Drug use: No    Review of Systems  Constitutional: No fever/chills Eyes: No visual changes. ENT: No sore throat. Cardiovascular: Denies chest pain. Respiratory: Denies shortness of breath. Gastrointestinal: No abdominal pain.  No nausea, no vomiting.  No diarrhea.  No constipation. Genitourinary: Negative for dysuria. Musculoskeletal: Positive for back pain. Skin: Negative for rash. Neurological: Negative for headaches, focal weakness or numbness.  10-point ROS otherwise negative.  ____________________________________________   PHYSICAL EXAM:  VITAL SIGNS: ED Triage Vitals  Enc Vitals Group     BP 02/29/20 1515 110/82     Pulse Rate 02/29/20 1515 (!) 128     Resp 02/29/20 1515 16     Temp 02/29/20 1515 98.7 F (37.1 C)     Temp Source 02/29/20 1515 Oral     SpO2 02/29/20 1515 99 %     Weight  02/29/20 1513 223 lb (101.2 kg)     Height 02/29/20 1513 5\' 3"  (1.6 m)   Constitutional: Alert and oriented. Well appearing and in no acute distress. Eyes: Conjunctivae are normal. Head: Atraumatic. Nose: No congestion/rhinnorhea. Mouth/Throat: Mucous membranes are moist.  Neck: No stridor.  Cardiovascular: Normal rate, regular rhythm. Good peripheral circulation. Grossly normal heart sounds.   Respiratory: Normal respiratory effort. Gastrointestinal: No distention.  Musculoskeletal: No lower extremity tenderness nor edema. No gross deformities of extremities. No midline lumbar or thoracic spine tenderness.  Neurologic:  Normal speech and language. No gross focal neurologic deficits are appreciated. Normal strength and sensation in the lower extremities. 2+ patellar reflexes bilaterally.  Skin:  Skin is warm, dry and intact. No rash noted.  ____________________________________________   LABS (all labs ordered are listed, but only abnormal results are displayed)  None ____________________________________________   PROCEDURES  Procedure(s) performed:   Procedures  None ____________________________________________   INITIAL IMPRESSION / ASSESSMENT AND PLAN / ED COURSE  Pertinent labs & imaging results that were available during my care of the patient were reviewed by me and considered in my medical decision making (see chart for details).   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 No findings on exam or historical features to suspect spine emergency or prompt emergency MRI. No UTI symptoms. Adjusting pain meds for the weekend and patient to f/u with her NSG and PCP team to help with PT referral. Discussed ED return precautions.    ____________________________________________  FINAL CLINICAL IMPRESSION(S) / ED DIAGNOSES  Final diagnoses:  Radicular low back pain    Note:  This document was prepared using Dragon voice recognition software  and may include unintentional dictation errors.  Alona Bene, MD, [facility]    Talise Sligh, Arlyss Repress, MD 03/04/20 1048

## 2020-02-29 NOTE — ED Triage Notes (Signed)
Back pain with radiation down her right leg. She was seen x 2 recently and told she has arthritis and to take her current pain medication.

## 2020-02-29 NOTE — Discharge Instructions (Signed)
You have been seen in the Emergency Department (ED)  today for back pain.  Your workup and exam have not shown any acute abnormalities and you are likely suffering from muscle strain or possible problems with your discs, but there is no treatment that will fix your symptoms at this time.    Please continue to try and follow up with the physical therapy service.   I have increased the dose of your Gabapentin to 600 mg BID and have called in a prescription to your pharmacy for 1-2 tabs of 50 mg Tramadol to take for pain.   Please follow up with your doctor as soon as possible regarding today's ED visit and your back pain.  Return to the ED for worsening back pain, fever, weakness or numbness of either leg, or if you develop either (1) an inability to urinate or have bowel movements, or (2) loss of your ability to control your bathroom functions (if you start having "accidents"), or if you develop other new symptoms that concern you.

## 2020-09-26 DIAGNOSIS — M48062 Spinal stenosis, lumbar region with neurogenic claudication: Secondary | ICD-10-CM | POA: Insufficient documentation

## 2022-09-12 DIAGNOSIS — N1831 Chronic kidney disease, stage 3a: Secondary | ICD-10-CM | POA: Insufficient documentation

## 2022-12-02 ENCOUNTER — Encounter (HOSPITAL_BASED_OUTPATIENT_CLINIC_OR_DEPARTMENT_OTHER): Payer: Self-pay

## 2022-12-02 ENCOUNTER — Emergency Department (HOSPITAL_BASED_OUTPATIENT_CLINIC_OR_DEPARTMENT_OTHER): Payer: Medicare Other

## 2022-12-02 ENCOUNTER — Emergency Department (HOSPITAL_BASED_OUTPATIENT_CLINIC_OR_DEPARTMENT_OTHER)
Admission: EM | Admit: 2022-12-02 | Discharge: 2022-12-02 | Disposition: A | Discharge status: Home / Self Care | Payer: Medicare Other | Attending: Emergency Medicine | Admitting: Emergency Medicine

## 2022-12-02 ENCOUNTER — Other Ambulatory Visit: Payer: Self-pay

## 2022-12-02 DIAGNOSIS — R5383 Other fatigue: Secondary | ICD-10-CM | POA: Diagnosis present

## 2022-12-02 DIAGNOSIS — R600 Localized edema: Secondary | ICD-10-CM | POA: Insufficient documentation

## 2022-12-02 DIAGNOSIS — E1122 Type 2 diabetes mellitus with diabetic chronic kidney disease: Secondary | ICD-10-CM | POA: Insufficient documentation

## 2022-12-02 DIAGNOSIS — R519 Headache, unspecified: Secondary | ICD-10-CM | POA: Diagnosis not present

## 2022-12-02 DIAGNOSIS — Z7984 Long term (current) use of oral hypoglycemic drugs: Secondary | ICD-10-CM | POA: Insufficient documentation

## 2022-12-02 DIAGNOSIS — I129 Hypertensive chronic kidney disease with stage 1 through stage 4 chronic kidney disease, or unspecified chronic kidney disease: Secondary | ICD-10-CM | POA: Insufficient documentation

## 2022-12-02 DIAGNOSIS — N183 Chronic kidney disease, stage 3 unspecified: Secondary | ICD-10-CM | POA: Insufficient documentation

## 2022-12-02 DIAGNOSIS — Z7982 Long term (current) use of aspirin: Secondary | ICD-10-CM | POA: Diagnosis not present

## 2022-12-02 DIAGNOSIS — Z794 Long term (current) use of insulin: Secondary | ICD-10-CM | POA: Diagnosis not present

## 2022-12-02 DIAGNOSIS — E860 Dehydration: Secondary | ICD-10-CM | POA: Insufficient documentation

## 2022-12-02 DIAGNOSIS — Z79899 Other long term (current) drug therapy: Secondary | ICD-10-CM | POA: Insufficient documentation

## 2022-12-02 DIAGNOSIS — N179 Acute kidney failure, unspecified: Secondary | ICD-10-CM | POA: Diagnosis not present

## 2022-12-02 LAB — BASIC METABOLIC PANEL
Anion gap: 14 (ref 5–15)
BUN: 39 mg/dL — ABNORMAL HIGH (ref 8–23)
CO2: 24 mmol/L (ref 22–32)
Calcium: 9.2 mg/dL (ref 8.9–10.3)
Chloride: 97 mmol/L — ABNORMAL LOW (ref 98–111)
Creatinine, Ser: 1.82 mg/dL — ABNORMAL HIGH (ref 0.44–1.00)
GFR, Estimated: 29 mL/min — ABNORMAL LOW (ref >60)
Glucose, Bld: 112 mg/dL — ABNORMAL HIGH (ref 70–99)
Potassium: 3.3 mmol/L — ABNORMAL LOW (ref 3.5–5.1)
Sodium: 135 mmol/L (ref 135–145)

## 2022-12-02 LAB — URINALYSIS, ROUTINE W REFLEX MICROSCOPIC
Bilirubin Urine: NEGATIVE
Glucose, UA: NEGATIVE mg/dL
Ketones, ur: NEGATIVE mg/dL
Nitrite: NEGATIVE
Protein, ur: 100 mg/dL — AB
Specific Gravity, Urine: >=1.03 (ref 1.005–1.030)
pH: 5 (ref 5.0–8.0)

## 2022-12-02 LAB — TROPONIN I (HIGH SENSITIVITY)
Troponin I (High Sensitivity): 30 ng/L — ABNORMAL HIGH (ref <18)
Troponin I (High Sensitivity): 29 ng/L — ABNORMAL HIGH (ref <18)

## 2022-12-02 LAB — URINALYSIS, MICROSCOPIC (REFLEX)

## 2022-12-02 LAB — CBC
HCT: 39.9 % (ref 36.0–46.0)
Hemoglobin: 12.8 g/dL (ref 12.0–15.0)
MCH: 29.3 pg (ref 26.0–34.0)
MCHC: 32.1 g/dL (ref 30.0–36.0)
MCV: 91.3 fL (ref 80.0–100.0)
Platelets: 385 10*3/uL (ref 150–400)
RBC: 4.37 MIL/uL (ref 3.87–5.11)
RDW: 15.4 % (ref 11.5–15.5)
WBC: 12.5 10*3/uL — ABNORMAL HIGH (ref 4.0–10.5)
nRBC: 0 % (ref 0.0–0.2)

## 2022-12-02 LAB — MAGNESIUM: Magnesium: 1.7 mg/dL (ref 1.7–2.4)

## 2022-12-02 LAB — BRAIN NATRIURETIC PEPTIDE: B Natriuretic Peptide: 65.5 pg/mL (ref 0.0–100.0)

## 2022-12-02 MED ORDER — LACTATED RINGERS IV BOLUS
500.0000 mL | Freq: Once | INTRAVENOUS | Status: AC
  Administered 2022-12-02: 500 mL via INTRAVENOUS

## 2022-12-02 MED ORDER — FOSFOMYCIN TROMETHAMINE 3 G PO PACK
3.0000 g | PACK | Freq: Once | ORAL | Status: AC
  Administered 2022-12-02: 3 g via ORAL
  Filled 2022-12-02: qty 3

## 2022-12-02 NOTE — ED Provider Notes (Signed)
[place] [facility] Provider Note   CSN: [id number] Arrival date & time: 12/02/22  1650     History  Chief Complaint  Patient presents with   Abnormal ECG    Tracy Parks is a 72 y.o. female with HTN, HLD, T2DM, IDA, spinal stenosis, history DVT, CKD stage III, thrombocytosis, who presents with abnormal EKG.   Pt reports she was sent here by Urgent Care due to an abnormal EKG but she does not know what was abnormal with it. She went to UC to have a covid test due to feeling fatigued, tired, and with mild headache x 2-3 days. She has had decreased PO intake due to not feeling very well. Denies CP, SOB, cough, palpitations, orthopnea, N/V/D/C, urinary symptoms. Endorses some BL feet swelling which is normal for her. Covid test at [facility] was negative.      HPI     Home Medications Prior to Admission medications   Medication Sig Start Date End Date Taking? Authorizing Provider  aspirin 81 MG tablet Take 81 mg by mouth daily.    [provider]  calcium carbonate (OS-CAL) 600 MG TABS Take 600 mg by mouth 2 (two) times daily with a meal.    [provider]  Calcium Carbonate-Vitamin D 600-200 MG-UNIT TABS Take by mouth.    [provider]  chlorthalidone (HYGROTON) 25 MG tablet Take 2 tablets by mouth daily. 09/09/17   [provider]  cyclobenzaprine (FLEXERIL) 10 MG tablet Take 1 tablet (10 mg total) by mouth 3 (three) times daily as needed for muscle spasms. 09/04/15   Molpus, John, MD  gabapentin (NEURONTIN) 300 MG capsule Take 2 capsules (600 mg total) by mouth 2 (two) times daily. 02/29/20   Long, Arlyss Repress, MD  glimepiride (AMARYL) 4 MG tablet TAKE 1 TABLET BY MOUTH WITH BREAKFAST AND WITH SUPPER 08/10/17   [provider]  glipiZIDE (GLUCOTROL) 5 MG tablet Take 5 mg by mouth 2 (two) times daily before a meal.    [provider]  HYDROcodone-acetaminophen (NORCO/VICODIN) 5-325 MG tablet Take 1-2  tablets by mouth every 6 (six) hours as needed (for neck pain). 09/04/15   Molpus, John, MD  insulin glargine (LANTUS) 100 UNIT/ML injection Inject 30 Units into the skin daily.    [provider]  insulin glargine (LANTUS) 100 UNIT/ML injection INJECT 25 UNITS EVERY NIGHT AT BEDTIME 12/10/09   [provider]  losartan (COZAAR) 25 MG tablet Take 25 mg by mouth daily.    [provider]  losartan (COZAAR) 25 MG tablet Take 1 tablet by mouth daily. 09/10/09   [provider]  metFORMIN (GLUCOPHAGE) 1000 MG tablet Take 1,000 mg by mouth 2 (two) times daily with a meal.    [provider]  metFORMIN (GLUCOPHAGE) 1000 MG tablet Take 1 tablet by mouth 2 (two) times daily. 09/30/09   [provider]  omeprazole (PRILOSEC) 20 MG capsule Take 1 capsule (20 mg total) by mouth daily. 08/17/16   Pisciotta, Joni Reining, PA-C  rosuvastatin (CRESTOR) 10 MG tablet Take by mouth. 11/27/19   [provider]  simvastatin (ZOCOR) 40 MG tablet Take 40 mg by mouth every evening.    [provider]  simvastatin (ZOCOR) 40 MG tablet TAKE 1 TABLET(40 MG) BY MOUTH DAILY 09/30/09   [provider]  traMADol (ULTRAM) 50 MG tablet Take 2 tablets (100 mg total) by mouth every 12 (twelve) hours as needed for severe pain. 02/29/20  Long, Arlyss Repress, MD      Allergies    Jardiance [empagliflozin], Lisinopril, and Victoza [liraglutide]    Review of Systems   Review of Systems Review of systems Negative for CP, SOB.  A 10 point review of systems was performed and is negative unless otherwise reported in HPI.  Physical Exam Updated Vital Signs BP 121/74 (BP Location: Right Arm)   Pulse 83   Temp 98.4 F (36.9 C) (Oral)   Resp 16   SpO2 100%  Physical Exam General: Normal appearing female, lying in bed.  HEENT: Sclera anicteric, MMM, trachea midline.  Cardiology: RRR, no murmurs/rubs/gallops. BL radial and DP pulses equal bilaterally.  Resp: Normal  respiratory rate and effort. CTAB, no wheezes, rhonchi, crackles.  Abd: Soft, non-tender, non-distended. No rebound tenderness or guarding.  GU: Deferred. MSK: +1 nonpitting edema of feet bilaterally. No signs of trauma. Extremities without deformity or TTP. No cyanosis or clubbing. Skin: warm, dry.  Back: No CVA tenderness Neuro: A&Ox4, CNs II-XII grossly intact. MAEs. Sensation grossly intact.  Psych: Normal mood and affect.   ED Results / Procedures / Treatments   Labs (all labs ordered are listed, but only abnormal results are displayed) Labs Reviewed  BASIC METABOLIC PANEL - Abnormal; Notable for the following components:      Result Value   Potassium 3.3 (*)    Chloride 97 (*)    Glucose, Bld 112 (*)    BUN 39 (*)    Creatinine, Ser 1.82 (*)    GFR, Estimated 29 (*)    All other components within normal limits  CBC - Abnormal; Notable for the following components:   WBC 12.5 (*)    All other components within normal limits  URINALYSIS, ROUTINE W REFLEX MICROSCOPIC - Abnormal; Notable for the following components:   APPearance CLOUDY (*)    Hgb urine dipstick SMALL (*)    Protein, ur 100 (*)    Leukocytes,Ua LARGE (*)    All other components within normal limits  URINALYSIS, MICROSCOPIC (REFLEX) - Abnormal; Notable for the following components:   Bacteria, UA MANY (*)    All other components within normal limits  TROPONIN I (HIGH SENSITIVITY) - Abnormal; Notable for the following components:   Troponin I (High Sensitivity) 30 (*)    All other components within normal limits  TROPONIN I (HIGH SENSITIVITY) - Abnormal; Notable for the following components:   Troponin I (High Sensitivity) 29 (*)    All other components within normal limits  MAGNESIUM  BRAIN NATRIURETIC PEPTIDE  URINALYSIS, W/ REFLEX TO CULTURE (INFECTION SUSPECTED)    EKG EKG Interpretation  Date/Time:  Wednesday Dec 02 2022 17:07:56 EDT Ventricular Rate:  105 PR Interval:  168 QRS Duration: 98 QT  Interval:  348 QTC Calculation: 460 R Axis:   116 Text Interpretation: Sinus tachycardia Right axis deviation Incomplete LBBB Similar to prior EKG Confirmed by Vivi Barrack [phone]) on 12/02/2022 5:19:21 PM  Radiology DG Chest 2 View  Result Date: 12/02/2022 CLINICAL DATA:  Fatigue.  Headache.  Abnormal EKG. EXAM: CHEST - 2 VIEW COMPARISON:  Chest radiographs 08/17/2016, 11/08/2011 FINDINGS: Cardiac silhouette is mildly enlarged, similar to prior. Moderate calcification within the aortic arch. No pulmonary edema, pleural effusion, or pneumothorax. No focal airspace opacity. Moderate multilevel disc space narrowing of the thoracic spine. IMPRESSION: Mild cardiomegaly. No acute pulmonary process. Electronically Signed   By: Neita Garnet M.D.   On: 12/02/2022 17:50    Procedures Procedures    Medications Ordered  in ED Medications  fosfomycin (MONUROL) packet 3 g (has no administration in time range)  lactated ringers bolus 500 mL ( Intravenous Stopped 12/02/22 1947)    ED Course/ Medical Decision Making/ A&P                          Medical Decision Making Amount and/or Complexity of Data Reviewed Labs: ordered. Decision-making details documented in ED Course. Radiology: ordered. Decision-making details documented in ED Course.  Risk Prescription drug management.    This patient presents to the ED for concern of fatigue, c/f abnormal EKG; this involves an extensive number of treatment options, and is a complaint that carries with it a high risk of complications and morbidity.  I considered the following differential and admission for this acute, potentially life threatening condition.   MDM:    DDX for fatigue/gen weakness includes but is not limited to:  Infectious processes such as viral syndrome or UTI, severe metabolic derangements or electrolyte abnormalities, ischemia/ACS, heart failure, anemia. Patient with significant decreased PO intake likely dehydrated. Patient's EKG  sent from UC appears to have incomplete LBBB but no STEs or reciprocal depressions. No prolonged QTc, no ischemic changes. Patient's EKG here unchanged from that at [facility]. Unclear exactly what changes she was sent for. Given report of EKG changes as well as fatigue for 2 to 3 days and an elderly female with history of diabetes I do believe it is appropriate to get a troponin to ensure no ACS.  Clinical Course as of 12/02/22 2305  Wed Dec 02, 2022  1744 Creatinine(!): 1.82 +AKI with BUN 39, likely prerenal d/t decreased PO intake/dehydration, will give IVF [HN]  1753 DG Chest 2 View Mild cardiomegaly. No acute pulmonary process. [HN]  1753 Magnesium: 1.7 [HN]  1754 WBC(!): 12.5 + leukocytosis [HN]  1913 B Natriuretic Peptide: 65.5 [HN]  1913 Troponin I (High Sensitivity)(!): 30 [HN]  1913 Magnesium: 1.7 [HN]  2000 Troponin I (High Sensitivity)(!): 29 Flat and in s/o CKD [HN]  2000 Given 500 cc LR bolus [HN]  2259 Patient's original urinary sample contaminated with squamous cells, not able to interpret.  Discussed with patient that I would love to have a urine sample from her as I believe that UTI could be causing her fatigue and the leukocytosis and patient reluctantly agrees to wait to give a second urine sample.  On the repeated attempt had a urinary sample patient missed the urine hat.  Patient is not willing to wait around to give a third urine sample and because of my high concern for her here having UTI I will treat her here with 1 dose of fosfomycin.  I instructed patient to follow-up with her primary care physician within 1 week.  Patient states she has already called them to make an appointment.  Patient will be discharged with discharge instructions and return precautions, instructed to stay well-hydrated at home.   [HN]    Clinical Course User Index [HN] Loetta Rough, MD    Labs: I Ordered, and personally interpreted labs.  The pertinent results include:  those listed  above  Imaging Studies ordered: I ordered imaging studies including chest x-ray I independently visualized and interpreted imaging. I agree with the radiologist interpretation  Additional history obtained from sister at bedside, chart review.   Cardiac Monitoring: The patient was maintained on a cardiac monitor.  I personally viewed and interpreted the cardiac monitored which showed an underlying rhythm of: Normal  sinus rhythm  Reevaluation: After the interventions noted above, I reevaluated the patient and found that they have :improved  Social Determinants of Health: Patient lives independently   Disposition:  DC w/ discharge instructions/return precautions. All questions answered to patient's satisfaction.    Co morbidities that complicate the patient evaluation  Past Medical History:  Diagnosis Date   Arthritis    Diabetes mellitus      Medicines Meds ordered this encounter  Medications   lactated ringers bolus 500 mL   fosfomycin (MONUROL) packet 3 g    I have reviewed the patients home medicines and have made adjustments as needed  Problem List / ED Course: Problem List Items Addressed This Visit   None Visit Diagnoses     Fatigue, unspecified type    -  Primary   Dehydration       AKI (acute kidney injury) (HCC)                       This note was created using dictation software, which may contain spelling or grammatical errors.    Loetta Rough, MD 12/02/22 [phone]

## 2022-12-02 NOTE — ED Triage Notes (Signed)
Pt reports she was sent here by Urgent Care due to an abnormal EKG but she does not know what was abnormal with it. She went to UC to have a covid test due to feeling tired and she had a headache. (Covid was negative). Denies chest pain, denies any pain at all.

## 2022-12-02 NOTE — ED Notes (Signed)
Patient laid down flat for 10 minutes pre-orthostatics.

## 2022-12-02 NOTE — Discharge Instructions (Signed)
Thank you for coming to [facility] Emergency Department. You were seen for fatigue, abnormal EKG findings.  Your EKG was actually okay on arrival to the emergency department, however you were noted to have an acute kidney injury and likely significant dehydration.  We were unable to obtain a sufficient urine sample however due to high concern for urinary tract infection you were treated with 1 dose of antibiotics called fosfomycin here in the emergency department.  Please follow-up with your primary care physician within 1 to 2 weeks for follow-up.  Please ensure that you stay well-hydrated at home.  Do not hesitate to return to the ED or call 911 if you experience: -Worsening symptoms -Falls, trauma -Confusion, lethargy -Lightheadedness, passing out -Fevers/chills -Anything else that concerns you

## 2022-12-02 NOTE — ED Notes (Signed)
Pt tried giving a urine sample but missed the hat. Only had 2 drops of urine. Checked with lab and unable to run urine test.

## 2022-12-02 NOTE — ED Notes (Signed)
Patient transported to X-ray 

## 2023-10-28 ENCOUNTER — Ambulatory Visit (INDEPENDENT_AMBULATORY_CARE_PROVIDER_SITE_OTHER): Admitting: Podiatry

## 2023-10-28 DIAGNOSIS — Z794 Long term (current) use of insulin: Secondary | ICD-10-CM | POA: Diagnosis not present

## 2023-10-28 DIAGNOSIS — E119 Type 2 diabetes mellitus without complications: Secondary | ICD-10-CM

## 2023-10-28 DIAGNOSIS — Q666 Other congenital valgus deformities of feet: Secondary | ICD-10-CM | POA: Diagnosis not present

## 2023-10-28 NOTE — Progress Notes (Signed)
 Subjective:  Patient ID: Tracy Parks, female    DOB: 1951/03/27,  MRN: [id number]  Chief Complaint  Patient presents with   Diabetes    Pt stated she is here to order diabetic shoes     73 y.o. female presents with the above complaint.  Patient presents for bilateral flatfoot deformity with a history of diabetes.  She wanted to get it evaluated.  She would like to do her diabetic shoes she states she does not have any recent open wounds or lesions.  She does not know her last A1c she denies any other acute issues.  0 out of 10 pain.  Some neuropathy.   Review of Systems: Negative except as noted in the HPI. Denies N/V/F/Ch.  Past Medical History:  Diagnosis Date   Arthritis    Diabetes mellitus     Current Outpatient Medications:    aspirin 81 MG tablet, Take 81 mg by mouth daily., Disp: , Rfl:    calcium carbonate (OS-CAL) 600 MG TABS, Take 600 mg by mouth 2 (two) times daily with a meal., Disp: , Rfl:    Calcium Carbonate-Vitamin D 600-200 MG-UNIT TABS, Take by mouth., Disp: , Rfl:    chlorthalidone (HYGROTON) 25 MG tablet, Take 2 tablets by mouth daily., Disp: , Rfl:    cyclobenzaprine (FLEXERIL) 10 MG tablet, Take 1 tablet (10 mg total) by mouth 3 (three) times daily as needed for muscle spasms., Disp: 20 tablet, Rfl: 0   gabapentin (NEURONTIN) 300 MG capsule, Take 2 capsules (600 mg total) by mouth 2 (two) times daily., Disp: 14 capsule, Rfl: 0   glimepiride (AMARYL) 4 MG tablet, TAKE 1 TABLET BY MOUTH WITH BREAKFAST AND WITH SUPPER, Disp: , Rfl:    glipiZIDE (GLUCOTROL) 5 MG tablet, Take 5 mg by mouth 2 (two) times daily before a meal., Disp: , Rfl:    HYDROcodone-acetaminophen (NORCO/VICODIN) 5-325 MG tablet, Take 1-2 tablets by mouth every 6 (six) hours as needed (for neck pain)., Disp: 20 tablet, Rfl: 0   insulin glargine (LANTUS) 100 UNIT/ML injection, Inject 30 Units into the skin daily., Disp: , Rfl:    insulin glargine (LANTUS) 100 UNIT/ML injection, INJECT 25 UNITS  EVERY NIGHT AT BEDTIME, Disp: , Rfl:    losartan (COZAAR) 25 MG tablet, Take 25 mg by mouth daily., Disp: , Rfl:    losartan (COZAAR) 25 MG tablet, Take 1 tablet by mouth daily., Disp: , Rfl:    metFORMIN (GLUCOPHAGE) 1000 MG tablet, Take 1,000 mg by mouth 2 (two) times daily with a meal., Disp: , Rfl:    metFORMIN (GLUCOPHAGE) 1000 MG tablet, Take 1 tablet by mouth 2 (two) times daily., Disp: , Rfl:    omeprazole (PRILOSEC) 20 MG capsule, Take 1 capsule (20 mg total) by mouth daily., Disp: 10 capsule, Rfl: 0   rosuvastatin (CRESTOR) 10 MG tablet, Take by mouth., Disp: , Rfl:    simvastatin (ZOCOR) 40 MG tablet, Take 40 mg by mouth every evening., Disp: , Rfl:    simvastatin (ZOCOR) 40 MG tablet, TAKE 1 TABLET(40 MG) BY MOUTH DAILY, Disp: , Rfl:    traMADol (ULTRAM) 50 MG tablet, Take 2 tablets (100 mg total) by mouth every 12 (twelve) hours as needed for severe pain., Disp: 15 tablet, Rfl: 0  Social History   Tobacco Use  Smoking Status Never  Smokeless Tobacco Never    Allergies  Allergen Reactions   Jardiance [Empagliflozin]    Lisinopril    Victoza [Liraglutide] Rash    Also  chest discomfort   Objective:  There were no vitals filed for this visit. There is no height or weight on file to calculate BMI. Constitutional Well developed. Well nourished.  Vascular Dorsalis pedis pulses palpable bilaterally. Posterior tibial pulses palpable bilaterally. Capillary refill normal to all digits.  No cyanosis or clubbing noted. Pedal hair growth normal.  Neurologic Normal speech. Oriented to person, place, and time. Epicritic sensation to light touch grossly present bilaterally.  Dermatologic Nails well groomed and normal in appearance. No open wounds. No skin lesions.  Orthopedic: Bilateral pes planovalgus with calcaneal valgus with too many toe signs.  Unable to perform single and double heel rise.  No open wounds or lesions noted   Radiographs: None Assessment:   1. Pes  planovalgus   2. Type 2 diabetes mellitus without complication, with long-term current use of insulin (HCC)    Plan:  Patient was evaluated and treated and all questions answered.  Bilateral pes planovalgus with underlying diabetes - Given the patient has a flatfoot deformity with underlying diabetes she will benefit from diabetic shoes I discussed importance of diabetic shoes she states understanding elected proceed with diabetic shoes.  No follow-ups on file.

## 2023-11-06 NOTE — Progress Notes (Signed)
 Patient presents to the office today for diabetic shoe and insole measuring.  Patient was measured with brannock device to determine size and width for 1 pair of extra depth shoes and foam casted for 3 pair of insoles.   Documentation of medical necessity will be sent to patient's treating diabetic doctor to verify and sign.   Patient's diabetic provider: Debroah Fanning MD   Shoes and insoles will be ordered at that time and patient will be notified for an appointment for fitting when they arrive.   Shoe size (per patient): 10.5 Shoe choice:   817 1st choice not avail Shoe size ordered: 10.5XWD

## 2023-11-26 ENCOUNTER — Telehealth: Payer: Self-pay | Admitting: Podiatry

## 2023-11-26 NOTE — Telephone Encounter (Signed)
 Pt was seen by Dr. Lydia Sams on 4/17 and then came to see you with a prescription/order from another Dr. for diabetic shoes. She is checking on the status of that order.

## 2023-11-30 NOTE — Telephone Encounter (Signed)
 Pt called om 5/19 and Kerney Pee responded 5/20: Order has been placed but needs to have Dr.Smith who oversees Dr. Sallyanne Creamer to sign the paperwork and fax back. Pt is going to call the office. The paperwork was re-faxed to the Dr office again 5/19

## 2023-12-29 ENCOUNTER — Telehealth: Payer: Self-pay

## 2023-12-29 NOTE — Telephone Encounter (Signed)
 LVM to schedule DM shoe pick up

## 2024-01-03 ENCOUNTER — Ambulatory Visit (INDEPENDENT_AMBULATORY_CARE_PROVIDER_SITE_OTHER)

## 2024-01-03 DIAGNOSIS — M2141 Flat foot [pes planus] (acquired), right foot: Secondary | ICD-10-CM | POA: Diagnosis not present

## 2024-01-03 DIAGNOSIS — M2142 Flat foot [pes planus] (acquired), left foot: Secondary | ICD-10-CM | POA: Diagnosis not present

## 2024-01-03 DIAGNOSIS — E119 Type 2 diabetes mellitus without complications: Secondary | ICD-10-CM

## 2024-01-07 NOTE — Progress Notes (Signed)
 Patient presents today to pick up diabetic shoes and insoles.  Patient was dispensed 1 pair of diabetic shoes and 3 pairs of total contact diabetic insoles. Fit was satisfactory. Instructions for break-in and wear was reviewed and a copy was given to the patient.   Re-appointment for regularly scheduled diabetic foot care visits or if they should experience any trouble with the shoes or insoles.

## 2024-07-12 NOTE — Progress Notes (Signed)
 " Subjective Patient ID: Tracy Parks is a 73 y.o. female.  Chief Complaint  Patient presents with   Follow-up    The following information was reviewed by members of the visit team:  Tobacco  Allergies  Meds  Problems  Med Hx  Surg Hx  Fam Hx  Soc  Hx     HPI Tracy Parks is a 73 y.o. female here today to for a follow up and chronic management for:  1. Gastroesophageal reflux disease without esophagitis   2. Type 2 diabetes mellitus with hyperglycemia, with long-term current use of insulin    (CMD)   3. Type 2 diabetes mellitus with retinopathy of both eyes, with long-term current use of insulin, macular edema presence unspecified, unspecified retinopathy severity (CMD)   4. Fatty liver   5. Benign essential hypertension   6. Chronic diastolic (congestive) heart failure (CMD)   7. Atherosclerosis of aorta   8. Generalized osteoarthritis   9. Degeneration of intervertebral disc of lumbar region, unspecified whether pain present   10. Stage 3a chronic kidney disease (CMD)   11. Thrombocytosis   12. Chronic pain disorder   13. Current use of insulin    (CMD)   14. Hypercholesterolemia   15. Iron deficiency anemia, unspecified iron deficiency anemia type   16. Long term current use of oral hypoglycemic drug   17. Lymphedema of both lower extremities   18. Mixed hyperlipidemia   19. Obesity (BMI 30-39.9)   20. Other specified depressive episodes   21. Spinal stenosis of lumbar region with neurogenic claudication   22. Vitamin D deficiency      She complains of stopping her oxybutynin. She found that her legs would swell when she took the medication.It has returned to her baseline since she stopped the medication. She reports taking medications as prescribed. Her other medical issues have been stable.   The following portions of the patient's history were reviewed and updated as appropriate: allergies, current medications, past social history and  problem list.   Review of Systems  Constitutional:  Negative for activity change, appetite change, chills, diaphoresis, fatigue, fever and unexpected weight change.  HENT:  Negative for congestion, dental problem, drooling, ear discharge, ear pain, facial swelling, hearing loss, mouth sores, nosebleeds, postnasal drip, rhinorrhea, sinus pressure, sinus pain, sneezing, sore throat, tinnitus, trouble swallowing and voice change.   Eyes:  Negative for photophobia, pain, discharge, redness, itching and visual disturbance.  Respiratory:  Negative for apnea, cough, choking, chest tightness, shortness of breath, wheezing and stridor.   Cardiovascular:  Negative for chest pain, palpitations and leg swelling.  Gastrointestinal:  Negative for abdominal distention, abdominal pain, anal bleeding, blood in stool, constipation, diarrhea, nausea, rectal pain and vomiting.  Endocrine: Negative for cold intolerance, heat intolerance, polydipsia, polyphagia and polyuria.  Genitourinary:  Negative for decreased urine volume, difficulty urinating, dyspareunia, dysuria, enuresis, flank pain, frequency, genital sores, hematuria, menstrual problem, pelvic pain, urgency, vaginal bleeding, vaginal discharge and vaginal pain.  Musculoskeletal:  Negative for arthralgias, back pain, gait problem, joint swelling, myalgias and neck stiffness.  Skin:  Negative for color change, pallor, rash and wound.  Allergic/Immunologic: Negative for environmental allergies, food allergies and immunocompromised state.  Neurological:  Negative for dizziness, tremors, seizures, syncope, facial asymmetry, speech difficulty, weakness, light-headedness, numbness and headaches.  Hematological:  Negative for adenopathy. Does not bruise/bleed easily.  Psychiatric/Behavioral:  Negative for agitation, behavioral problems, confusion, decreased concentration, dysphoric mood, hallucinations, self-injury, sleep disturbance and suicidal ideas. The patient  is  not nervous/anxious and is not hyperactive.     Objective Physical Exam Vitals and nursing note reviewed. Exam conducted with a chaperone present.  Constitutional:      Appearance: Normal appearance.     Comments: Walks with walker  HENT:     Head: Normocephalic and atraumatic.     Right Ear: External ear normal.     Left Ear: External ear normal.     Nose: Nose normal.  Eyes:     General: No scleral icterus.       Right eye: No discharge.        Left eye: No discharge.     Extraocular Movements: Extraocular movements intact.     Conjunctiva/sclera: Conjunctivae normal.     Pupils: Pupils are equal, round, and reactive to light.  Cardiovascular:     Rate and Rhythm: Normal rate and regular rhythm.     Heart sounds: Normal heart sounds. No murmur heard.    No friction rub. No gallop.  Pulmonary:     Effort: Pulmonary effort is normal. No respiratory distress.     Breath sounds: Normal breath sounds. No stridor. No wheezing, rhonchi or rales.  Chest:     Chest wall: No tenderness.  Musculoskeletal:     Cervical back: Normal range of motion and neck supple.     Comments: Pedal edema, 1+ edema to mid calves bilaterally  Skin:    General: Skin is warm and dry.  Neurological:     General: No focal deficit present.     Mental Status: She is alert and oriented to person, place, and time.  Psychiatric:        Mood and Affect: Mood normal.        Behavior: Behavior normal.        Thought Content: Thought content normal.        Judgment: Judgment normal.     Assessment/Plan Diagnoses and all orders for this visit:  Gastroesophageal reflux disease without esophagitis Stable. PLAN: continue current regimen.  Type 2 diabetes mellitus with hyperglycemia, with long-term current use of insulin    (CMD) Check labs. PLAN: continue current regimen and follow-up based on results. -     Comprehensive Metabolic Panel; Future -     Urinalysis with Microscopic; Future -     Albumin,  Random Urine; Future  Mixed hyperlipidemia Check labs. PLAN: continue current regimen and follow-up based on results. -     CBC with Differential; Future -     Comprehensive Metabolic Panel; Future -     Lipid Panel; Future -     TSH; Future -     Vitamin D, 25-Hydroxy; Future -     Urinalysis with Microscopic; Future  Vitamin D deficiency Check labs. PLAN: continue current regimen and follow-up based on results. -     Vitamin D, 25-Hydroxy; Future  Iron deficiency anemia, unspecified iron deficiency anemia type Check labs. PLAN: continue current regimen and follow-up based on results. -     CBC with Differential; Future  Hypercholesterolemia Check labs. PLAN: continue current regimen and follow-up based on results. -     CBC with Differential; Future -     Comprehensive Metabolic Panel; Future -     Lipid Panel; Future -     TSH; Future -     Vitamin D, 25-Hydroxy; Future -     Urinalysis with Microscopic; Future  Obesity (BMI 30-39.9) Check labs. PLAN: continue current regimen and follow-up based on  results. -     Comprehensive Metabolic Panel; Future -     Urinalysis with Microscopic; Future -     Albumin, Random Urine; Future  Type 2 diabetes mellitus with retinopathy of both eyes, with long-term current use of insulin, macular edema presence unspecified, unspecified retinopathy severity (CMD) Check labs. PLAN: continue current regimen and follow-up based on results. -     Comprehensive Metabolic Panel; Future -     Urinalysis with Microscopic; Future -     Albumin, Random Urine; Future  Hyperlipidemia, mixed Check labs. PLAN: continue current regimen and follow-up based on results. -     CBC with Differential; Future -     Comprehensive Metabolic Panel; Future -     Lipid Panel; Future -     TSH; Future -     Vitamin D, 25-Hydroxy; Future -     Urinalysis with Microscopic; Future  Benign essential hypertension Check labs. PLAN: continue current regimen  and follow-up based on results. -     CBC with Differential; Future -     Comprehensive Metabolic Panel; Future -     Lipid Panel; Future -     TSH; Future -     Vitamin D, 25-Hydroxy; Future -     Urinalysis with Microscopic; Future  Fatty liver Stable. PLAN: continue current regimen.  Chronic diastolic (congestive) heart failure (CMD) Stable. PLAN: continue current regimen.  Atherosclerosis of aorta Stable. PLAN: continue current regimen.  Generalized osteoarthritis Stable. PLAN: continue current regimen.  Degeneration of intervertebral disc of lumbar region, unspecified whether pain present Stable. PLAN: continue current regimen.  Stage 3a chronic kidney disease (CMD) Check labs. PLAN: continue current regimen and follow-up based on results. -     Comprehensive Metabolic Panel; Future  Thrombocytosis Stable. PLAN: continue current regimen.  Chronic pain disorder Stable. PLAN: continue current regimen.  Current use of insulin    (CMD) Stable. PLAN: continue current regimen.  Long term current use of oral hypoglycemic drug Stable. PLAN: continue current regimen.  Lymphedema of both lower extremities Stable. PLAN: continue current regimen.  Other specified depressive episodes Stable. PLAN: continue current regimen.  Spinal stenosis of lumbar region with neurogenic claudication Stable. PLAN: continue current regimen.  Plan  See above.  This document serves as a record of services personally performed by Dr. Deane.  It was created on their behalf by Richerd Macario Asa, CMA, a trained medical scribe, and Certified Medical Assistant (CMA). During the course of documenting the history, physical exam and medical decision making, I was functioning as a stage manager. The creation of this record is the providers dictation and/or activities during the visit.   Electronically signed: Richerd Macario Asa, CMA 07/12/2024  3:53 PM   I agree the documentation  is accurate and complete.  Electronically signed by: Camie CHRISTELLA Deane, MD 07/12/2024 4:36 PM   "

## 2024-08-17 ENCOUNTER — Emergency Department (HOSPITAL_BASED_OUTPATIENT_CLINIC_OR_DEPARTMENT_OTHER)

## 2024-08-17 ENCOUNTER — Other Ambulatory Visit: Payer: Self-pay

## 2024-08-17 ENCOUNTER — Emergency Department (HOSPITAL_BASED_OUTPATIENT_CLINIC_OR_DEPARTMENT_OTHER)
Admission: EM | Admit: 2024-08-17 | Discharge: 2024-08-17 | Disposition: A | Discharge status: Home / Self Care | Source: Home / Self Care | Attending: Emergency Medicine | Admitting: Emergency Medicine

## 2024-08-17 ENCOUNTER — Encounter (HOSPITAL_BASED_OUTPATIENT_CLINIC_OR_DEPARTMENT_OTHER): Payer: Self-pay

## 2024-08-17 DIAGNOSIS — I10 Essential (primary) hypertension: Secondary | ICD-10-CM

## 2024-08-17 LAB — CBC WITH DIFFERENTIAL/PLATELET
Abs Immature Granulocytes: 0.21 10*3/uL — ABNORMAL HIGH (ref 0.00–0.07)
Basophils Absolute: 0 10*3/uL (ref 0.0–0.1)
Basophils Relative: 0 %
Eosinophils Absolute: 0 10*3/uL (ref 0.0–0.5)
Eosinophils Relative: 0 %
HCT: 38.9 % (ref 36.0–46.0)
Hemoglobin: 12.5 g/dL (ref 12.0–15.0)
Immature Granulocytes: 2 %
Lymphocytes Relative: 16 %
Lymphs Abs: 1.9 10*3/uL (ref 0.7–4.0)
MCH: 29.6 pg (ref 26.0–34.0)
MCHC: 32.1 g/dL (ref 30.0–36.0)
MCV: 92.2 fL (ref 80.0–100.0)
Monocytes Absolute: 1 10*3/uL (ref 0.1–1.0)
Monocytes Relative: 8 %
Neutro Abs: 8.6 10*3/uL — ABNORMAL HIGH (ref 1.7–7.7)
Neutrophils Relative %: 74 %
Platelets: 378 10*3/uL (ref 150–400)
RBC: 4.22 MIL/uL (ref 3.87–5.11)
RDW: 13.9 % (ref 11.5–15.5)
WBC: 11.8 10*3/uL — ABNORMAL HIGH (ref 4.0–10.5)
nRBC: 0.2 % (ref 0.0–0.2)

## 2024-08-17 LAB — BASIC METABOLIC PANEL WITH GFR
Anion gap: 12 (ref 5–15)
BUN: 37 mg/dL — ABNORMAL HIGH (ref 8–23)
CO2: 27 mmol/L (ref 22–32)
Calcium: 9.9 mg/dL (ref 8.9–10.3)
Chloride: 103 mmol/L (ref 98–111)
Creatinine, Ser: 1.42 mg/dL — ABNORMAL HIGH (ref 0.44–1.00)
GFR, Estimated: 39 mL/min — ABNORMAL LOW
Glucose, Bld: 108 mg/dL — ABNORMAL HIGH (ref 70–99)
Potassium: 4.2 mmol/L (ref 3.5–5.1)
Sodium: 143 mmol/L (ref 135–145)

## 2024-08-17 LAB — TROPONIN T, HIGH SENSITIVITY
Troponin T High Sensitivity: 65 ng/L — ABNORMAL HIGH (ref 0–19)
Troponin T High Sensitivity: 64 ng/L — ABNORMAL HIGH (ref 0–19)

## 2024-08-17 LAB — PRO BRAIN NATRIURETIC PEPTIDE: Pro Brain Natriuretic Peptide: 498 pg/mL — ABNORMAL HIGH

## 2024-08-17 LAB — MAGNESIUM: Magnesium: 1.9 mg/dL (ref 1.7–2.4)

## 2024-08-17 MED ORDER — FUROSEMIDE 10 MG/ML IJ SOLN
40.0000 mg | Freq: Once | INTRAMUSCULAR | Status: AC
  Administered 2024-08-17: 40 mg via INTRAVENOUS
  Filled 2024-08-17: qty 4

## 2024-08-17 NOTE — ED Notes (Signed)
 Pt apnea alarm ringing. Verified by RN that pt respiratory rate WNL. Breathing 14/min.

## 2024-08-17 NOTE — ED Triage Notes (Signed)
 Pt states that this morning when she checked her blood pressure it was elevated. Pt was concerned so she wanted to be checked out. Pt states that BP was 187/87

## 2024-08-17 NOTE — ED Notes (Signed)
 Pt asked if it's ok for her to eat and drink because her insulin pump is showing that her blood sugar is 62. Verified with provider that it's ok for her to eat and drink. Pt given peanut butter crackers and drink.

## 2024-08-17 NOTE — Discharge Instructions (Signed)
 You were seen in the emergency department for elevated blood pressure.  Your results are generally reassuring.  Your creatinine level and troponin level were borderline so please see your doctor within the next week and have her review all of today's results and your home blood pressure readings.  Watch your salt intake and get some emotional support right now.  If you develop any symptoms, please be reevaluated immediately.

## 2024-08-17 NOTE — ED Notes (Signed)
 Pt. Is alert and oriented with no distress noted.  Pt. Is eating crackers and drinking H2O.  Pt. Very tearful about her son that passed away 3 years ago.  Pt. In no physical distress... at present time.  Pt. Has noted 3+ pitting edema in the lower extremities.  Pt. Reports she normally wears ted hose at home and is up and about on a regular baisis.

## 2024-08-17 NOTE — ED Provider Notes (Signed)
 " [facility] Provider Note   CSN: [id number] Arrival date & time: 08/17/24  9256     Patient presents with: Hypertension   Tracy Parks is a 74 y.o. female.    Hypertension  74 year old female with a history of hypertension, hyperlipidemia, diabetes presents with elevated blood pressure.  She has no symptoms.  She showed me her blood pressure recordings and she runs between 90 and 140.  This morning she woke up and her blood pressure was in the 180s.  She states she has been very stressed as the birthday of her deceased son is coming up and she has been thinking about him.  Also had a big seafood dinner last night with deep-fried hash puppies and might of had more salt than usual.  Denies any change in vision, speech, gait.  No headache, chest pain, shortness of breath, numbness, weakness.  Accompanied by sister at bedside who states patient is at her baseline.    Prior to Admission medications  Medication Sig Start Date End Date Taking? Authorizing Provider  aspirin 81 MG tablet Take 81 mg by mouth daily.   Yes [provider]  calcium carbonate (OS-CAL) 600 MG TABS Take 600 mg by mouth 2 (two) times daily with a meal.   Yes [provider]  Calcium Carbonate-Vitamin D 600-200 MG-UNIT TABS Take by mouth.   Yes [provider]  chlorthalidone (HYGROTON) 25 MG tablet Take 2 tablets by mouth daily. 09/09/17  Yes [provider]  cyclobenzaprine  (FLEXERIL ) 10 MG tablet Take 1 tablet (10 mg total) by mouth 3 (three) times daily as needed for muscle spasms. 09/04/15  Yes Molpus, John, MD  gabapentin  (NEURONTIN ) 300 MG capsule Take 2 capsules (600 mg total) by mouth 2 (two) times daily. 02/29/20  Yes Long, Joshua G, MD  glimepiride (AMARYL) 4 MG tablet TAKE 1 TABLET BY MOUTH WITH BREAKFAST AND WITH SUPPER 08/10/17  Yes [provider]  glipiZIDE (GLUCOTROL) 5 MG tablet Take 5 mg by mouth 2 (two) times daily  before a meal.   Yes [provider]  insulin glargine (LANTUS) 100 UNIT/ML injection Inject 30 Units into the skin daily.   Yes [provider]  insulin glargine (LANTUS) 100 UNIT/ML injection INJECT 25 UNITS EVERY NIGHT AT BEDTIME 12/10/09  Yes [provider]  losartan (COZAAR) 25 MG tablet Take 25 mg by mouth daily.   Yes [provider]  losartan (COZAAR) 25 MG tablet Take 1 tablet by mouth daily. 09/10/09  Yes [provider]  metFORMIN (GLUCOPHAGE) 1000 MG tablet Take 1,000 mg by mouth 2 (two) times daily with a meal.   Yes [provider]  metFORMIN (GLUCOPHAGE) 1000 MG tablet Take 1 tablet by mouth 2 (two) times daily. 09/30/09  Yes [provider]  omeprazole  (PRILOSEC) 20 MG capsule Take 1 capsule (20 mg total) by mouth daily. 08/17/16  Yes Pisciotta, Nat, PA-C  rosuvastatin (CRESTOR) 10 MG tablet Take by mouth. 11/27/19  Yes [provider]  simvastatin (ZOCOR) 40 MG tablet Take 40 mg by mouth every evening.   Yes [provider]  simvastatin (ZOCOR) 40 MG tablet TAKE 1 TABLET(40 MG) BY MOUTH DAILY 09/30/09  Yes [provider]  traMADol  (ULTRAM ) 50 MG tablet Take 2 tablets (100 mg total) by mouth every 12 (twelve) hours as needed for severe pain. 02/29/20  Yes Long, Fonda MATSU, MD  HYDROcodone -acetaminophen  (NORCO/VICODIN) 5-325 MG tablet Take 1-2 tablets by mouth every  6 (six) hours as needed (for neck pain). 09/04/15   Molpus, John, MD    Allergies: Jardiance [empagliflozin], Lisinopril, and Victoza [liraglutide]    Review of Systems Elevated blood pressure Updated Vital Signs BP 117/62 (BP Location: Right Arm)   Pulse 88   Temp 98.6 F (37 C) (Oral)   Resp 20   Ht 5' 5 (1.651 m)   Wt 100.7 kg   SpO2 98%   BMI 36.94 kg/m   Physical Exam Vitals and nursing note reviewed.  Constitutional:      General: She is not in acute distress.    Appearance: She is well-developed.  HENT:     Head:  Normocephalic and atraumatic.  Eyes:     Conjunctiva/sclera: Conjunctivae normal.  Cardiovascular:     Rate and Rhythm: Normal rate and regular rhythm.     Heart sounds: No murmur heard. Pulmonary:     Effort: Pulmonary effort is normal. No respiratory distress.     Breath sounds: Normal breath sounds.  Abdominal:     Palpations: Abdomen is soft.     Tenderness: There is no abdominal tenderness.  Musculoskeletal:        General: Swelling present.     Cervical back: Neck supple.     Comments: Bilateral lower extremity edema+2, chronic per patient  Skin:    General: Skin is warm and dry.     Capillary Refill: Capillary refill takes less than 2 seconds.  Neurological:     Mental Status: She is alert.  Psychiatric:     Comments: Somewhat tearful but good eye contact, good insight and judgment, no suicidal or homicidal ideation.     (all labs ordered are listed, but only abnormal results are displayed) Labs Reviewed  BASIC METABOLIC PANEL WITH GFR - Abnormal; Notable for the following components:      Result Value   Glucose, Bld 108 (*)    BUN 37 (*)    Creatinine, Ser 1.42 (*)    GFR, Estimated 39 (*)    All other components within normal limits  CBC WITH DIFFERENTIAL/PLATELET - Abnormal; Notable for the following components:   WBC 11.8 (*)    Neutro Abs 8.6 (*)    Abs Immature Granulocytes 0.21 (*)    All other components within normal limits  PRO BRAIN NATRIURETIC PEPTIDE - Abnormal; Notable for the following components:   Pro Brain Natriuretic Peptide 498.0 (*)    All other components within normal limits  TROPONIN T, HIGH SENSITIVITY - Abnormal; Notable for the following components:   Troponin T High Sensitivity 65 (*)    All other components within normal limits  TROPONIN T, HIGH SENSITIVITY - Abnormal; Notable for the following components:   Troponin T High Sensitivity 64 (*)    All other components within normal limits  MAGNESIUM    EKG: EKG  Interpretation Date/Time:  Thursday August 17 2024 08:06:48 EST Ventricular Rate:  97 PR Interval:  156 QRS Duration:  104 QT Interval:  344 QTC Calculation: 437 R Axis:   4  Text Interpretation: Sinus rhythm Borderline ST elevation, anterior leads TWI III, aVF, V6 Unchanged from prior Confirmed by Fredia Kaufman ([id number]) on 08/17/2024 10:25:02 AM  Radiology: [facility] Chest Port 1 View Result Date: 08/17/2024 CLINICAL DATA:  Shortness of breath EXAM: PORTABLE CHEST 1 VIEW COMPARISON:  Dec 02, 2022 FINDINGS: Stable cardiomediastinal silhouette. Minimal bibasilar subsegmental atelectasis or scarring is noted. Bony thorax is unremarkable. IMPRESSION: Minimal bibasilar subsegmental atelectasis or scarring. Electronically Signed  By: Lynwood Landy Raddle M.D.   On: 08/17/2024 09:38     Procedures   Medications Ordered in the ED  furosemide  (LASIX ) injection 40 mg (40 mg Intravenous Given 08/17/24 0934)    Clinical Course as of 08/17/24 1359  Thu Aug 17, 2024  1358 Creatinine at 1.42 which is slightly elevated from previous at 1.2 but within her range over the past year.  Mild leukocytosis.  Troponin slightly elevated but stable. [AJ]  1358 Chest x-ray clear [AJ]    Clinical Course User Index [AJ] Fredia Rosette Kirsch, MD                                 Medical Decision Making Amount and/or Complexity of Data Reviewed Independent Historian:     Details: Sister External Data Reviewed: labs and notes. Labs: ordered. Radiology: ordered. ECG/medicine tests: ordered.  Risk Prescription drug management.   74 year old female presents with asymptomatic elevated blood pressure.  Results here show slight increase in creatinine that is still within her range over the past year.  Equivocal troponin that is stable.  No chest pain or shortness of breath or other anginal equivalent symptoms to suggest cardiac ischemia.  Neurologic exam is normal.  Blood pressure improved without intervention.  However I  did give her an additional dose of Lasix  for her lower extremity edema which helped her blood pressure further.  Overall she is feeling well and would like to go home.  Has excellent follow-up and I advised her to let her doctor know about her creatinine and troponin.  If she develops any symptoms she should be reevaluated immediately.     Final diagnoses:  Hypertension, unspecified type    ED Discharge Orders     None          Fredia Rosette Kirsch, MD 08/17/24 1359  "
